# Patient Record
Sex: Male | Born: 1995 | Race: White | Hispanic: No | Marital: Married
Health system: Midwestern US, Community
[De-identification: ages and names within clinical notes are randomized; demographics above are authoritative.]

## PROBLEM LIST (undated history)

## (undated) DIAGNOSIS — M545 Low back pain, unspecified: Secondary | ICD-10-CM

## (undated) DIAGNOSIS — M5136 Other intervertebral disc degeneration, lumbar region: Secondary | ICD-10-CM

## (undated) HISTORY — DX: Low back pain, unspecified: M54.50

## (undated) HISTORY — PX: WISDOM TOOTH EXTRACTION: SHX21

## (undated) HISTORY — DX: Other intervertebral disc degeneration, lumbar region: M51.36

---
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
## 2016-08-29 ENCOUNTER — Emergency Department (HOSPITAL_BASED_OUTPATIENT_CLINIC_OR_DEPARTMENT_OTHER)
Admission: EM | Admit: 2016-08-29 | Discharge: 2016-08-29 | Disposition: A | Discharge status: Home / Self Care | Payer: BLUE CROSS/BLUE SHIELD | Attending: Emergency Medicine | Admitting: Emergency Medicine

## 2016-08-29 ENCOUNTER — Encounter (HOSPITAL_BASED_OUTPATIENT_CLINIC_OR_DEPARTMENT_OTHER): Payer: Self-pay

## 2016-08-29 ENCOUNTER — Emergency Department (HOSPITAL_BASED_OUTPATIENT_CLINIC_OR_DEPARTMENT_OTHER): Payer: BLUE CROSS/BLUE SHIELD

## 2016-08-29 DIAGNOSIS — S99922A Unspecified injury of left foot, initial encounter: Secondary | ICD-10-CM | POA: Diagnosis present

## 2016-08-29 DIAGNOSIS — Y30XXXA Falling, jumping or pushed from a high place, undetermined intent, initial encounter: Secondary | ICD-10-CM | POA: Diagnosis not present

## 2016-08-29 DIAGNOSIS — Y929 Unspecified place or not applicable: Secondary | ICD-10-CM | POA: Insufficient documentation

## 2016-08-29 DIAGNOSIS — Y9339 Activity, other involving climbing, rappelling and jumping off: Secondary | ICD-10-CM | POA: Insufficient documentation

## 2016-08-29 DIAGNOSIS — Y999 Unspecified external cause status: Secondary | ICD-10-CM | POA: Insufficient documentation

## 2016-08-29 DIAGNOSIS — S9032XA Contusion of left foot, initial encounter: Secondary | ICD-10-CM | POA: Insufficient documentation

## 2016-08-29 DIAGNOSIS — F17210 Nicotine dependence, cigarettes, uncomplicated: Secondary | ICD-10-CM | POA: Insufficient documentation

## 2016-08-29 MED ORDER — IBUPROFEN 400 MG PO TABS
600.0000 mg | ORAL_TABLET | Freq: Once | ORAL | Status: AC
  Administered 2016-08-29: 600 mg via ORAL
  Filled 2016-08-29: qty 1

## 2016-08-29 MED ORDER — HYDROCODONE-ACETAMINOPHEN 5-325 MG PO TABS
1.0000 | ORAL_TABLET | Freq: Once | ORAL | Status: DC
Start: 2016-08-29 — End: 2016-08-29

## 2016-08-29 NOTE — ED Notes (Signed)
Patient transported to X-ray 

## 2016-08-29 NOTE — ED Triage Notes (Signed)
Pt states he jumped off 8-9 foot drop last night-pain to left foot-NAD-walking with crutches

## 2016-08-29 NOTE — ED Provider Notes (Signed)
[facility]   CSN: [id number] Arrival date & time: 08/29/16  1752  By signing my name below, I, [name], attest that this documentation has been prepared under the direction and in the presence of Pricilla Loveless, MD. Electronically Signed: Gillis Ends. Lyn Hollingshead, ED Scribe. 08/29/16. 6:20 PM.  History   Chief Complaint Chief Complaint  Patient presents with  . Foot Pain    HPI HPI Comments: Tyler [name] is a 21 y.o. male who presents to the Emergency Department complaining of sudden onset, constant, left foot pain in the calcaneus region that occurred during the evening of 08/28/16. Pt reports that he "jumped off of an 8 foot column" when the pain suddenly began. He notes that he was wearing cowboy boots. Associated swelling of the left foot present. He notes that pain is exacerbated when bearing weight.  Denies any numbness to the extremity, back pain, or right foot pain.   The history is provided by the patient. No language interpreter was used.    History reviewed. No pertinent past medical history.  There are no active problems to display for this patient.  History reviewed. No pertinent surgical history.   Home Medications    Prior to Admission medications   Not on File    Family History No family history on file.  Social History Social History  Substance Use Topics  . Smoking status: Current Every Day Smoker    Types: Cigarettes  . Smokeless tobacco: Former Neurosurgeon  . Alcohol use Yes     Comment: occ    Allergies   Review of patient's allergies indicates no known allergies.   Review of Systems Review of Systems  Musculoskeletal: Positive for arthralgias and joint swelling. Negative for back pain.  Neurological: Negative for numbness.  All other systems reviewed and are negative.  Physical Exam Updated Vital Signs BP 125/81 (BP Location: Left Arm)   Pulse 98   Temp 98.1 F (36.7 C) (Oral)   Resp 18   Ht 6'  1" (1.854 m)   Wt 160 lb (72.6 kg)   SpO2 100%   BMI 21.11 kg/m   Physical Exam  Constitutional: He is oriented to person, place, and time. He appears well-developed and well-nourished.  HENT:  Head: Normocephalic and atraumatic.  Right Ear: External ear normal.  Left Ear: External ear normal.  Nose: Nose normal.  Eyes: Right eye exhibits no discharge. Left eye exhibits no discharge.  Neck: Neck supple.  Cardiovascular: Normal rate, normal heart sounds and intact distal pulses.   2+ DP pulse left foot  Pulmonary/Chest: Effort normal.  Abdominal: He exhibits no distension.  Musculoskeletal: He exhibits no edema.       Left ankle: He exhibits normal range of motion. No tenderness. Achilles tendon exhibits no pain and no defect.       Left foot: There is tenderness and swelling.       Feet:  Neurological: He is alert and oriented to person, place, and time.  Skin: Skin is warm and dry.  Nursing note and vitals reviewed.  ED Treatments / Results  DIAGNOSTIC STUDIES: Oxygen Saturation is 100% on RA, normal by my interpretation.    COORDINATION OF CARE: 6:10 PM-Discussed treatment plan which includes X-ray of left foot and order of Ibuprofen with pt at bedside and pt agreed to plan.  Radiology Dg Foot Complete Left  Result Date: 08/29/2016 CLINICAL DATA:  Left heel injury with swelling. EXAM: LEFT FOOT - COMPLETE 3+  VIEW COMPARISON:  None. FINDINGS: There is no evidence of fracture or dislocation. There is no evidence of arthropathy or other focal bone abnormality. Soft tissues are unremarkable. IMPRESSION: Negative. Electronically Signed   By: Delbert PhenixJason A Poff M.D.   On: 08/29/2016 18:29   Procedures Procedures (including critical care time)  Medications Ordered in ED Medications  ibuprofen (ADVIL,MOTRIN) tablet 600 mg (600 mg Oral Given 08/29/16 1826)   Initial Impression / Assessment and Plan / ED Course  I have reviewed the triage vital signs and the nursing  notes.  Pertinent labs & imaging results that were available during my care of the patient were reviewed by me and considered in my medical decision making (see chart for details).  Clinical Course    X-ray shows no obvious fracture. Given he cannot bear weight, continued use crutches that he heart he has at home and will Ace wrap. I doubt missed fracture, but if he is still unable to bear weight or having pain he will need to follow-up with with the orthopedics. Ibuprofen and ice.  Final Clinical Impressions(s) / ED Diagnoses   Final diagnoses:  Contusion of left heel, initial encounter    New Prescriptions New Prescriptions   No medications on file   I personally performed the services described in this documentation, which was scribed in my presence. The recorded information has been reviewed and is accurate.     Pricilla LovelessScott Mariam Helbert, MD 08/29/16 [phone]

## 2018-02-24 ENCOUNTER — Emergency Department (HOSPITAL_BASED_OUTPATIENT_CLINIC_OR_DEPARTMENT_OTHER): Payer: Worker's Compensation

## 2018-02-24 ENCOUNTER — Other Ambulatory Visit: Payer: Self-pay

## 2018-02-24 ENCOUNTER — Emergency Department (HOSPITAL_BASED_OUTPATIENT_CLINIC_OR_DEPARTMENT_OTHER)
Admission: EM | Admit: 2018-02-24 | Discharge: 2018-02-25 | Disposition: A | Discharge status: Home / Self Care | Payer: Worker's Compensation | Attending: Emergency Medicine | Admitting: Emergency Medicine

## 2018-02-24 ENCOUNTER — Encounter (HOSPITAL_BASED_OUTPATIENT_CLINIC_OR_DEPARTMENT_OTHER): Payer: Self-pay | Admitting: *Deleted

## 2018-02-24 DIAGNOSIS — F17210 Nicotine dependence, cigarettes, uncomplicated: Secondary | ICD-10-CM | POA: Diagnosis not present

## 2018-02-24 DIAGNOSIS — M545 Low back pain, unspecified: Secondary | ICD-10-CM

## 2018-02-24 MED ORDER — KETOROLAC TROMETHAMINE 30 MG/ML IJ SOLN
30.0000 mg | Freq: Once | INTRAMUSCULAR | Status: AC
  Administered 2018-02-24: 30 mg via INTRAMUSCULAR
  Filled 2018-02-24: qty 1

## 2018-02-24 NOTE — ED Triage Notes (Signed)
Back injury at work today. Pt states he is filing injury with his private insurance.

## 2018-02-25 MED ORDER — PREDNISONE 10 MG PO TABS: 20.0000 mg | ORAL_TABLET | Freq: Every day | ORAL | 0 refills | Status: AC

## 2018-02-25 MED ORDER — CYCLOBENZAPRINE HCL 10 MG PO TABS: 10.0000 mg | ORAL_TABLET | Freq: Two times a day (BID) | ORAL | 0 refills | Status: DC | PRN

## 2018-02-25 MED ORDER — PREDNISONE 50 MG PO TABS
60.0000 mg | ORAL_TABLET | Freq: Once | ORAL | Status: AC
  Administered 2018-02-25: 01:00:00 60 mg via ORAL
  Filled 2018-02-25: qty 1

## 2018-02-25 NOTE — ED Provider Notes (Signed)
[facility] EMERGENCY DEPARTMENT Provider Note   CSN: [id number] Arrival date & time: 02/24/18  2004     History   Chief Complaint Chief Complaint  Patient presents with  . Back Pain    HPI Tyler [name] is a 23 y.o. male presenting to the ED with acute onset of low back pain after injury at work around 11:30 AM today.  Patient states he was squatting down to lift an object, and felt immediate pain.  Localizes pain to lower back over sacrum, that is worse with movement.  Medications taken for pain prior to arrival.  Denies saddle paresthesias, weakness in extremities, bowel or bladder incontinence, or other complaints.  No previous injuries to back.  The history is provided by the patient.    History reviewed. No pertinent past medical history.  There are no active problems to display for this patient.   Past Surgical History:  Procedure Laterality Date  . WISDOM TOOTH EXTRACTION         Home Medications    Prior to Admission medications   Medication Sig Start Date End Date Taking? Authorizing Provider  cyclobenzaprine (FLEXERIL) 10 MG tablet Take 1 tablet (10 mg total) by mouth 2 (two) times daily as needed for muscle spasms. 02/25/18   Emit Kuenzel, [place] N, PA-C  predniSONE (DELTASONE) 10 MG tablet Take 2 tablets (20 mg total) by mouth daily for 5 days. 02/25/18 03/02/18  Juanmanuel Marohl, [place] N, PA-C    Family History No family history on file.  Social History Social History   Tobacco Use  . Smoking status: Current Every Day Smoker    Types: Cigarettes  . Smokeless tobacco: Former Engineer, waterUser  Substance Use Topics  . Alcohol use: Yes    Comment: occ  . Drug use: No     Allergies   Patient has no known allergies.   Review of Systems Review of Systems  Gastrointestinal:       No bowel incontinence  Genitourinary: Negative for difficulty urinating.  Musculoskeletal: Positive for back pain.  Neurological: Negative for weakness.     Physical  Exam Updated Vital Signs BP 135/86 (BP Location: Left Arm)   Pulse (!) 111   Temp 99.1 F (37.3 C) (Oral)   Resp 16   Ht 5\' 11"  (1.803 m)   Wt 68 kg (150 lb)   SpO2 100%   BMI 20.92 kg/m   Physical Exam  Constitutional: He appears well-developed and well-nourished. No distress.  HENT:  Head: Normocephalic and atraumatic.  Eyes: Conjunctivae are normal.  Cardiovascular: Normal rate and intact distal pulses.  Pulmonary/Chest: Effort normal.  Musculoskeletal:  No midline C, T, or L-spine tenderness.  TTP over sacral region, without gross deformities.  Neurological:  Motor:  Normal tone. 5/5 in  lower extremities bilaterally including strong and equal dorsiflexion/plantar flexion Sensory: Pinprick and light touch normal in all extremities.  Deep Tendon Reflexes: 2+ and symmetric in the biceps and patella Gait: normal gait and balance CV: distal pulses palpable throughout    Psychiatric: He has a normal mood and affect. His behavior is normal.  Nursing note and vitals reviewed.    ED Treatments / Results  Labs (all labs ordered are listed, but only abnormal results are displayed) Labs Reviewed - No data to display  EKG  EKG Interpretation None       Radiology Dg Lumbar Spine Complete  Result Date: 02/24/2018 CLINICAL DATA:  Back pain EXAM: LUMBAR SPINE - COMPLETE 4+ VIEW COMPARISON:  None.  FINDINGS: There is no evidence of lumbar spine fracture. Alignment is normal. Minimal disc space narrowing at L5-S1. IMPRESSION: No acute osseous abnormality Electronically Signed   By: Jasmine Pang M.D.   On: 02/24/2018 23:42    Procedures Procedures (including critical care time)  Medications Ordered in ED Medications  predniSONE (DELTASONE) tablet 60 mg (not administered)  ketorolac (TORADOL) 30 MG/ML injection 30 mg (30 mg Intramuscular Given 02/24/18 2341)     Initial Impression / Assessment and Plan / ED Course  I have reviewed the triage vital signs and the nursing  notes.  Pertinent labs & imaging results that were available during my care of the patient were reviewed by me and considered in my medical decision making (see chart for details).    Patient with acute sacral back pain.  No neurological deficits and normal neuro exam.  Patient can walk but states is painful.  No loss of bowel or bladder control.  No concern for cauda equina. Xray without acute fracture. No fever, night sweats, weight loss, h/o cancer, IVDU.  RICE protocol and pain medicine indicated and discussed with patient.   Discussed results, findings, treatment and follow up. Patient advised of return precautions. Patient verbalized understanding and agreed with plan.  Final Clinical Impressions(s) / ED Diagnoses   Final diagnoses:  Acute bilateral low back pain without sciatica    ED Discharge Orders        Ordered    predniSONE (DELTASONE) 10 MG tablet  Daily     02/25/18 0008    cyclobenzaprine (FLEXERIL) 10 MG tablet  2 times daily PRN     02/25/18 0008       Ashkan Chamberland, [place] N, PA-C 02/25/18 0025    Shon Baton, MD 02/25/18 [phone]

## 2018-02-25 NOTE — ED Notes (Signed)
Pt teaching provided on medications that may cause drowsiness. Pt instructed not to drive or operate heavy machinery while taking the prescribed medication. Pt verbalized understanding.   

## 2018-02-25 NOTE — Discharge Instructions (Signed)
Please read instructions below.  Talk with your PCP about any new medications.  You can take aleve every 12 hours for pain.  Tomorrow, take prednisone daily for 5 days. Apply ice to your back for 20 minutes at a time.  Return to ER if new numbness or tingling in your arms or legs, inability to urinate, inability to hold your bowels, or weakness in your extremities.

## 2018-03-04 ENCOUNTER — Other Ambulatory Visit: Payer: Self-pay

## 2018-03-04 ENCOUNTER — Encounter (HOSPITAL_COMMUNITY): Payer: Self-pay | Admitting: Emergency Medicine

## 2018-03-04 ENCOUNTER — Emergency Department (HOSPITAL_COMMUNITY)
Admission: EM | Admit: 2018-03-04 | Discharge: 2018-03-05 | Disposition: A | Discharge status: Home / Self Care | Payer: Worker's Compensation | Attending: Emergency Medicine | Admitting: Emergency Medicine

## 2018-03-04 DIAGNOSIS — F17210 Nicotine dependence, cigarettes, uncomplicated: Secondary | ICD-10-CM | POA: Insufficient documentation

## 2018-03-04 DIAGNOSIS — M5417 Radiculopathy, lumbosacral region: Secondary | ICD-10-CM | POA: Insufficient documentation

## 2018-03-04 DIAGNOSIS — M549 Dorsalgia, unspecified: Secondary | ICD-10-CM | POA: Diagnosis present

## 2018-03-04 NOTE — ED Triage Notes (Signed)
Pt c/o lower back pain 5/10 for the past week, pt states he got seen for the same last week they prescribed pain medication and muscle relaxer and he is not getting better.

## 2018-03-05 MED ORDER — IBUPROFEN 400 MG PO TABS: 400.0000 mg | ORAL_TABLET | Freq: Four times a day (QID) | ORAL | 0 refills | Status: DC | PRN

## 2018-03-05 NOTE — ED Provider Notes (Signed)
[name] EMERGENCY DEPARTMENT Provider Note   CSN: [id number] Arrival date & time: 03/04/18  2206     History   Chief Complaint Chief Complaint  Patient presents with  . Back Pain    HPI Tyler FAUCETT is a 23 y.o. male.  The history is provided by the patient.  Back Pain   This is a recurrent problem. The current episode started more than 2 days ago. The problem occurs daily. The problem has been gradually worsening. The pain is associated with lifting heavy objects. The pain is present in the lumbar spine. The pain is moderate. The symptoms are aggravated by certain positions. Pertinent negatives include no chest pain, no fever, no abdominal pain, no bladder incontinence and no weakness. He has tried muscle relaxants (prednisone) for the symptoms. The treatment provided no relief.  Patient presents for back pain. Patient was first seen on February 25 for a back injury due to work.  He had an x-ray that was done that was negative, and was discharged home with Flexeril and prednisone, but is still having back pain.  He does report that once he went back to work his pain worsened.  No new leg weakness.  No incontinence is reported.  No history of back surgery.  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 WISDOM TOOTH EXTRACTION         Home Medications    Prior to Admission medications   Medication Sig Start Date End Date Taking? Authorizing Provider  ibuprofen (ADVIL,MOTRIN) 400 MG tablet Take 1 tablet (400 mg total) by mouth every 6 (six) hours as needed for moderate pain. 03/05/18   Tyler Rhine, MD    Family History No family history on file.  Social History Social History   Tobacco Use  . Smoking status: Current Every Day Smoker    Types: Cigarettes  . Smokeless tobacco: Former  Engineer, water Use Topics  . Alcohol use: Yes    Comment: occ  . Drug use: No     Allergies   Patient has no known allergies.   Review of Systems Review of Systems  Constitutional: Negative for fever.  Cardiovascular: Negative for chest pain.  Gastrointestinal: Negative for abdominal pain.  Genitourinary: Negative for bladder incontinence.  Musculoskeletal: Positive for back pain.  Neurological: Negative for weakness.  All other systems reviewed and are negative.    Physical Exam Updated Vital Signs BP 140/77   Pulse 96   Temp 98.5 F (36.9 C) (Oral)   Resp 18   Ht 1.803 m (5\' 11" )   Wt 68 kg (150 lb)   SpO2 98%   BMI 20.92 kg/m   Physical Exam CONSTITUTIONAL: Well developed/well nourished HEAD: Normocephalic/atraumatic EYES: EOMI ENMT: Mucous membranes moist NECK: supple no meningeal signs SPINE/BACK:entire spine nontender, no bruising/crepitance/stepoffs noted to spine CV: S1/S2 noted, no murmurs/rubs/gallops noted LUNGS: Lungs are clear to auscultation bilaterally, no apparent distress ABDOMEN: soft, nontender GU:no cva tenderness NEURO: Awake/alert equal motor 5/5 strength noted with the following: hip flexion/knee flexion/extension, foot dorsi/plantar flexion, no sensory deficit.  Pt is able to ambulate unassisted. EXTREMITIES: pulses normal, full ROM SKIN: warm, color normal PSYCH: no abnormalities of mood noted, alert and oriented to situation    ED Treatments / Results  Labs (all labs ordered are listed, but  only abnormal results are displayed) Labs Reviewed - No data to display  EKG  EKG Interpretation None       Radiology No results found.  Procedures Procedures (including critical care time)  Medications Ordered in ED Medications - No data to display   Initial Impression / Assessment and Plan / ED Course  I have reviewed the triage vital signs and the nursing notes.       Patient With repeat visit for continued back pain  after a possible work injury.  He has no neuro deficits.  He is ambulatory.  He may have a radiculopathy, as he reports he has been diagnosed with this previously.  Plan will be off of work for the next several days, and advised him he may develop with a specialist.  He may need to go through [employer] to have this completed  Final Clinical Impressions(s) / ED Diagnoses   Final diagnoses:  Lumbosacral radiculopathy    ED Discharge Orders        Ordered    ibuprofen (ADVIL,MOTRIN) 400 MG tablet  Every 6 hours PRN     03/05/18 0117       [facility] [name], Tyler Whitworth, MD 03/05/18 0130

## 2018-03-05 NOTE — Discharge Instructions (Signed)

## 2018-03-05 NOTE — ED Notes (Signed)
Pt unable to sign for discharge, pt verbalizes consent for discharge

## 2018-03-09 ENCOUNTER — Emergency Department (HOSPITAL_COMMUNITY)
Admission: EM | Admit: 2018-03-09 | Discharge: 2018-03-09 | Disposition: A | Discharge status: Home / Self Care | Payer: Worker's Compensation | Attending: Emergency Medicine | Admitting: Emergency Medicine

## 2018-03-09 ENCOUNTER — Encounter (HOSPITAL_COMMUNITY): Payer: Self-pay | Admitting: Emergency Medicine

## 2018-03-09 DIAGNOSIS — F17210 Nicotine dependence, cigarettes, uncomplicated: Secondary | ICD-10-CM | POA: Diagnosis not present

## 2018-03-09 DIAGNOSIS — M545 Low back pain: Secondary | ICD-10-CM | POA: Insufficient documentation

## 2018-03-09 DIAGNOSIS — G8929 Other chronic pain: Secondary | ICD-10-CM

## 2018-03-09 MED ORDER — METHOCARBAMOL 500 MG PO TABS: 500.0000 mg | ORAL_TABLET | Freq: Every evening | ORAL | 0 refills | Status: DC | PRN

## 2018-03-09 MED ORDER — IBUPROFEN 600 MG PO TABS: 600.0000 mg | ORAL_TABLET | Freq: Four times a day (QID) | ORAL | 0 refills | Status: DC | PRN

## 2018-03-09 MED ORDER — MENTHOL-CAMPHOR 16-11 % EX CREA: 1.0000 "application " | TOPICAL_CREAM | Freq: Three times a day (TID) | CUTANEOUS | 0 refills | Status: DC | PRN

## 2018-03-09 NOTE — ED Triage Notes (Signed)
Pt states 3 weeks ago injured his back. Completing workers comp. Was told to follow up with the neurologist. Seen here 3/5 for pain that was not getting better. Waiting to hear back from workers comp to see if they have a neurologist they would prefer. States he is here today for a "check up" to make sure there is no changes and states he needs a longer work note because his job wont let him go back to work until the work comp is completed, states he has to be seen so he can get another work note.

## 2018-03-09 NOTE — Discharge Instructions (Signed)
As discussed, The medicine prescribed can help with muscle spasm but cannot be taken if driving, with alcohol or operating machinery.  ° °Follow up with your Primary care provider if symptoms persist beyond a week. ° °Return if worsening or new concerning symptoms in the meantime.  °

## 2018-03-09 NOTE — ED Notes (Signed)
Declined W/C at D/C and was escorted to lobby by RN. 

## 2018-03-09 NOTE — ED Provider Notes (Signed)
[facility]   CSN: [id number] Arrival date & time: 03/09/18  1433     History   Chief Complaint Chief Complaint  Patient presents with  . Back Pain  . work note    HPI Tyler [name] is a 23 y.o. male with no past medical history presenting with 3 weeks of chronic lower back pain after lifting a heavy wall at work.  He states that he was seen initially prescribed Flexeril and prednisone and told to return if no improvement.  He returned due to continued pain and was referred to neurologist but due to issues with workmen compensation he did not want to see anyone that was not approved.  He is here because he did not receive a call from them and is unsure if he is cleared to follow-up. He denies any new symptoms other than one episode of tingling in his legs which fully resolved.  No loss of bowel or bladder function, no difficulty walking. Ports improvement with relaxation, rest, muscle relaxer at nighttime. His pain is aggravated by going from a seated position to standing.  He has taken his medications as prescribed with some relief.  He is here because he needs to get a note so that he is able to follow-up tomorrow with neurology.   HPI  History reviewed. No pertinent past medical history.  There are no active problems to display for this patient.   Past Surgical History:  Procedure Laterality Date  . WISDOM TOOTH EXTRACTION         Home Medications    Prior to Admission medications   Medication Sig Start Date End Date Taking? Authorizing Provider  ibuprofen (ADVIL,MOTRIN) 600 MG tablet Take 1 tablet (600 mg total) by mouth every 6 (six) hours as needed. 03/09/18   Georgiana ShoreMitchell, Drequan Ironside B, PA-C  Menthol-Camphor (ICY HOT ADVANCED RELIEF) 16-11 % CREA Apply 1 application topically 3 (three) times daily as needed. 03/09/18   Georgiana ShoreMitchell, Khristie Sak B, PA-C  methocarbamol (ROBAXIN) 500 MG tablet Take 1 tablet (500 mg total) by  mouth at bedtime as needed. 03/09/18   Georgiana ShoreMitchell, Addaline Peplinski B, PA-C    Family History History reviewed. No pertinent family history.  Social History Social History   Tobacco Use  . Smoking status: Current Every Day Smoker    Types: Cigarettes  . Smokeless tobacco: Former Engineer, waterUser  Substance Use Topics  . Alcohol use: Yes    Comment: occ  . Drug use: No     Allergies   Patient has no known allergies.   Review of Systems Review of Systems  Constitutional: Negative for chills, diaphoresis, fatigue and fever.  HENT: Negative for sore throat.   Respiratory: Negative for cough and shortness of breath.   Cardiovascular: Negative for chest pain and palpitations.  Gastrointestinal: Negative for abdominal pain, nausea and vomiting.  Genitourinary: Negative for decreased urine volume, difficulty urinating, dysuria and hematuria.  Musculoskeletal: Positive for back pain and myalgias. Negative for arthralgias, gait problem, joint swelling, neck pain and neck stiffness.  Skin: Negative for color change, pallor and rash.  Neurological: Negative for weakness and numbness.       Patient experienced brief episode of tingling in his legs while feeding his dog earlier today. No symptoms since.     Physical Exam Updated Vital Signs BP 122/75 (BP Location: Right Arm)   Pulse (!) 114   Temp 98.6 F (37 C) (Oral)   Resp 14   Ht 5\' 11"  (  1.803 m)   Wt 68 kg (150 lb)   SpO2 99%   BMI 20.92 kg/m   Physical Exam  Constitutional: He appears well-developed and well-nourished. No distress.  Afebrile, well-appearing nontoxic sitting comfortably in bed no acute distress.  HENT:  Head: Normocephalic and atraumatic.  Eyes: Conjunctivae are normal.  Neck: Neck supple.  Cardiovascular: Normal rate, regular rhythm, normal heart sounds and intact distal pulses.  No murmur heard. Pulmonary/Chest: Effort normal and breath sounds normal. No stridor. No respiratory distress.  Musculoskeletal: Normal range  of motion. He exhibits no edema, tenderness or deformity.  No tenderness on palpation  Neurological: He is alert. No sensory deficit. He exhibits normal muscle tone.  5/5 strength to lower extremities bilaterally, normal stance and gait, normal balance.  Skin: Skin is warm and dry. No rash noted. He is not diaphoretic. No erythema. No pallor.  Psychiatric: He has a normal mood and affect.  Nursing note and vitals reviewed.    ED Treatments / Results  Labs (all labs ordered are listed, but only abnormal results are displayed) Labs Reviewed - No data to display  EKG  EKG Interpretation None       Radiology No results found.  Procedures Procedures (including critical care time)  Medications Ordered in ED Medications - No data to display   Initial Impression / Assessment and Plan / ED Course  I have reviewed the triage vital signs and the nursing notes.  Pertinent labs & imaging results that were available during my care of the patient were reviewed by me and considered in my medical decision making (see chart for details).    Patient presents with lower back pain. He has been seen twice for same in the last 3 weeks. He is requesting a note in order to allow time to follow up with neurology as previously prescribed.  No gross neurological deficits and normal neuro exam.  Heart rate 84 on my assessment.  No midline tenderness palpation. Patient has no gait abnormality or concern for cauda equina.  No loss of bowel or bladder control, fever, night sweats, weight loss, h/o malignancy, or IVDU.    Reviewed lumbar plain films from last visit which were normal.  RICE protocol and pain medications indicated and discussed with patient.   Advised patient to call first thing in the morning for his follow-up.  Will discharge home with Robaxin, muscle creams advised heat massage and ibuprofen 600 every 6 hours and close follow-up with PCP.  Discussed strict return precautions and  advised to return to the emergency department if experiencing any new or worsening symptoms. Instructions were understood and patient agreed with discharge plan.  Final Clinical Impressions(s) / ED Diagnoses   Final diagnoses:  Chronic midline low back pain without sciatica    ED Discharge Orders        Ordered    ibuprofen (ADVIL,MOTRIN) 600 MG tablet  Every 6 hours PRN     03/09/18 1654    methocarbamol (ROBAXIN) 500 MG tablet  At bedtime PRN     03/09/18 1654    Menthol-Camphor (ICY HOT ADVANCED RELIEF) 16-11 % CREA  3 times daily PRN     03/09/18 1654       Gregary Cromer 03/09/18 1725    Benjiman Core, MD 03/10/18 [phone]

## 2018-03-12 ENCOUNTER — Emergency Department (HOSPITAL_COMMUNITY)
Admission: EM | Admit: 2018-03-12 | Discharge: 2018-03-12 | Disposition: A | Discharge status: Home / Self Care | Payer: Worker's Compensation | Attending: Emergency Medicine | Admitting: Emergency Medicine

## 2018-03-12 ENCOUNTER — Other Ambulatory Visit: Payer: Self-pay

## 2018-03-12 DIAGNOSIS — Y9289 Other specified places as the place of occurrence of the external cause: Secondary | ICD-10-CM | POA: Insufficient documentation

## 2018-03-12 DIAGNOSIS — R200 Anesthesia of skin: Secondary | ICD-10-CM | POA: Diagnosis not present

## 2018-03-12 DIAGNOSIS — M545 Low back pain: Secondary | ICD-10-CM | POA: Insufficient documentation

## 2018-03-12 DIAGNOSIS — R202 Paresthesia of skin: Secondary | ICD-10-CM | POA: Insufficient documentation

## 2018-03-12 DIAGNOSIS — X500XXA Overexertion from strenuous movement or load, initial encounter: Secondary | ICD-10-CM | POA: Diagnosis not present

## 2018-03-12 DIAGNOSIS — F17210 Nicotine dependence, cigarettes, uncomplicated: Secondary | ICD-10-CM | POA: Diagnosis not present

## 2018-03-12 DIAGNOSIS — G8929 Other chronic pain: Secondary | ICD-10-CM

## 2018-03-12 DIAGNOSIS — Y990 Civilian activity done for income or pay: Secondary | ICD-10-CM | POA: Insufficient documentation

## 2018-03-12 DIAGNOSIS — Y9389 Activity, other specified: Secondary | ICD-10-CM | POA: Diagnosis not present

## 2018-03-12 NOTE — ED Notes (Signed)
No answer for triage.

## 2018-03-12 NOTE — ED Provider Notes (Signed)
[facility] EM[facility]ENC[facility] Provider Note   CSN: [id number] Arrival date & time: 03/12/18  1755     History   Chief Complaint Chief Complaint  Patient presents with  . Back Pain    HPI Jetta LoutZachary K Madera is a 23 y.o. male who presents with a request for a work note.  He states that he initially injured his back in February.  He was lifting a folding wall and his coworkers let go of it before he did and he felt an acute onset of low back pain.  He was seen at [facility], had plain films of his low back which was unremarkable, and has been seen 2 subsequent times in the emergency department for this problem.  He was initially diagnosed with a muscle strain however his pain has been persistent and he has had numbness and tingling of his legs at times.  He was told that he should follow-up with neurosurgery however he has not been able to because the injury is under Workmen's Comp. and he states he does not have a claim number yet.  He talked to his lawyer today who advised him to come to the emergency department for work note.  He denies any additional complaints.  HPI  No past medical history on file.  There are no active problems to display for this patient.   Past Surgical History:  Procedure Laterality Date  . WISDOM TOOTH EXTRACTION         Home Medications    Prior to Admission medications   Medication Sig Start Date End Date Taking? Authorizing Provider  ibuprofen (ADVIL,MOTRIN) 600 MG tablet Take 1 tablet (600 mg total) by mouth every 6 (six) hours as needed. 03/09/18   Georgiana ShoreMitchell, Jessica B, PA-C  Menthol-Camphor (ICY HOT ADVANCED RELIEF) 16-11 % CREA Apply 1 application topically 3 (three) times daily as needed. 03/09/18   Georgiana ShoreMitchell, Jessica B, PA-C  methocarbamol (ROBAXIN) 500 MG tablet Take 1 tablet (500 mg total) by mouth at bedtime as needed. 03/09/18   Georgiana ShoreMitchell, Jessica B, PA-C    Family History No family history on file.  Social  History Social History   Tobacco Use  . Smoking status: Current Every Day Smoker    Types: Cigarettes  . Smokeless tobacco: Former Engineer, waterUser  Substance Use Topics  . Alcohol use: Yes    Comment: occ  . Drug use: No     Allergies   Patient has no known allergies.   Review of Systems Review of Systems  Musculoskeletal: Positive for back pain. Negative for gait problem.  Neurological: Negative for weakness.     Physical Exam Updated Vital Signs BP (!) 142/94   Pulse 76   Temp 97.9 F (36.6 C)   Resp 18   Ht 5\' 11"  (1.803 m)   Wt 68 kg (150 lb)   SpO2 99%   BMI 20.92 kg/m   Physical Exam  Constitutional: He is oriented to person, place, and time. He appears well-developed and well-nourished. No distress.  Calm, cooperative, polite  HENT:  Head: Normocephalic and atraumatic.  Eyes: Conjunctivae are normal. Pupils are equal, round, and reactive to light. Right eye exhibits no discharge. Left eye exhibits no discharge. No scleral icterus.  Neck: Normal range of motion.  Cardiovascular: Normal rate.  Pulmonary/Chest: Effort normal. No respiratory distress.  Abdominal: He exhibits no distension.  Musculoskeletal:  Back: Inspection: No masses, deformity, or rash Palpation: No midline spinal tenderness. No paraspinal muscle tenderness. Pt reports pain  with back movement. Strength: 5/5 in lower extremities and normal plantar and dorsiflexion Sensation: Intact sensation with light touch in lower extremities bilaterally Gait: Normal gait   Neurological: He is alert and oriented to person, place, and time.  Skin: Skin is warm and dry.  Psychiatric: He has a normal mood and affect. His behavior is normal.  Nursing note and vitals reviewed.    ED Treatments / Results  Labs (all labs ordered are listed, but only abnormal results are displayed) Labs Reviewed - No data to display  EKG  EKG Interpretation None       Radiology No results  found.  Procedures Procedures (including critical care time)  Medications Ordered in ED Medications - No data to display   Initial Impression / Assessment and Plan / ED Course  I have reviewed the triage vital signs and the nursing notes.  Pertinent labs & imaging results that were available during my care of the patient were reviewed by me and considered in my medical decision making (see chart for details).  23 year old male presents essentially for a work note for his chronic back pain.  He was given 2 days.  He denies severe back pain, current radicular symptoms, weakness, bowel or bladder incontinence.  He was advised to follow-up with neurosurgery and return if worsening.  Final Clinical Impressions(s) / ED Diagnoses   Final diagnoses:  Chronic midline low back pain without sciatica    ED Discharge Orders    None       Bethel Born, PA-C 03/12/18 2215    Mancel Bale, MD 03/13/18 [phone]

## 2018-03-12 NOTE — ED Triage Notes (Signed)
Pt here for a work note, pt injured back in February r/t workers comp. Pt lawyer wants him out of work until his back is better but pt has not followed up with neurosurgery to receive a work note.

## 2018-03-16 ENCOUNTER — Ambulatory Visit (HOSPITAL_COMMUNITY)
Admission: EM | Admit: 2018-03-16 | Discharge: 2018-03-16 | Disposition: A | Discharge status: Home / Self Care | Payer: Worker's Compensation

## 2018-03-16 ENCOUNTER — Encounter (HOSPITAL_COMMUNITY): Payer: Self-pay | Admitting: *Deleted

## 2018-03-16 DIAGNOSIS — M545 Low back pain: Secondary | ICD-10-CM | POA: Diagnosis not present

## 2018-03-16 NOTE — ED Provider Notes (Signed)
03/16/2018 6:44 PM   DOB: 1995-03-27 / MRN: [id number]  SUBJECTIVE:  Tyler [name] is a 23 y.o. male presenting for recheck of back pain.  He tells me "I need a note for work keeping me out until I can see my neurosurgeon."  He is already been to the emergency room twice for this issue.  His lawyers have advised that he not go to work and he needs a note stating such medically so he does not get fired from his job.  He has some tingling in his legs but no weakness.  He denies a loss of strength.  He has No Known Allergies.   He  has no past medical history on file.    He  reports that he has been smoking cigarettes.  He has quit using smokeless tobacco. He reports that he drinks alcohol. He reports that he does not use drugs. He  has no sexual activity history on file. The patient  has a past surgical history that includes Wisdom tooth extraction.  His family history includes Healthy in his father and mother.  Review of Systems  Constitutional: Negative for chills, diaphoresis and fever.  Respiratory: Negative for cough, hemoptysis, sputum production, shortness of breath and wheezing.   Cardiovascular: Negative for chest pain, orthopnea and leg swelling.  Gastrointestinal: Negative for nausea.  Genitourinary: Negative for dysuria, frequency, hematuria and urgency.  Skin: Negative for rash.  Neurological: Negative for dizziness.    OBJECTIVE:  BP 111/76 (BP Location: Right Arm)   Pulse 96   Temp 98.5 F (36.9 C) (Oral)   Resp 16   SpO2 100%   Physical Exam  Constitutional: He is oriented to person, place, and time. He appears well-developed. He is active and cooperative.  Non-toxic appearance.  Eyes: EOM are normal. Pupils are equal, round, and reactive to light.  Cardiovascular: Normal rate, regular rhythm, S1 normal, S2 normal, normal heart sounds, intact distal pulses and normal pulses. Exam reveals no gallop and no friction rub.  No murmur heard. Pulmonary/Chest: Effort  normal. No stridor. No tachypnea. No respiratory distress. He has no wheezes. He has no rales.  Abdominal: He exhibits no distension.  Musculoskeletal: He exhibits no edema.  Neurological: He is alert and oriented to person, place, and time. He has normal strength and normal reflexes. He is not disoriented. No cranial nerve deficit or sensory deficit. He exhibits normal muscle tone. Coordination and gait normal.  Skin: Skin is warm and dry. He is not diaphoretic. No pallor.  Psychiatric: His behavior is normal.  Vitals reviewed.   No results found for this or any previous visit (from the past 72 hour(s)).  No results found.  ASSESSMENT AND PLAN:  No orders of the defined types were placed in this encounter.    Acute low back pain, unspecified back pain laterality, with sciatica presence unspecified      The patient is advised to call or return to clinic if he does not see an improvement in symptoms, or to seek the care of the closest emergency department if he worsens with the above plan.   Deliah BostonMichael Konstance Happel, MHS, PA-C 03/16/2018 6:44 PM    Ofilia Neaslark, Gimena Buick L, PA-C 03/16/18 1845

## 2018-03-16 NOTE — Discharge Instructions (Signed)
Continue your meds.  I hope today's note suffices to keep you out of work without job.

## 2018-03-16 NOTE — ED Triage Notes (Signed)
Patient reports injuring his back at work 2/25. Patient has been seen in the EDs for the back pain and notes. Patient is waiting for neurosurgery to call him back for an appointment. Patient ambulatory. Patient reports constant lower back pain that is intermittent in pain level.

## 2019-06-22 ENCOUNTER — Other Ambulatory Visit: Payer: Self-pay | Admitting: *Deleted

## 2019-06-22 ENCOUNTER — Telehealth: Payer: Self-pay | Admitting: *Deleted

## 2019-06-22 NOTE — Telephone Encounter (Signed)
Patient called back and confirmed appointment with Dr. Donnetta Hutching and verbalized understanding to bring all films and notify case mgr.

## 2019-06-22 NOTE — Telephone Encounter (Signed)
Voice mail message left for patient and Newton Pigg case Freight forwarder. Appt  With Dr. Donnetta Hutching on 06/30/2019 to be at office at 8:05 and bring al films related to this procedure to this appointment. ALIF L5-S1 with Dr. Lynann Bologna scheduled for 07/06/2019. Asked patient to cll office back to confirm.

## 2019-06-24 ENCOUNTER — Other Ambulatory Visit: Payer: Self-pay | Admitting: Orthopedic Surgery

## 2019-06-30 ENCOUNTER — Ambulatory Visit (INDEPENDENT_AMBULATORY_CARE_PROVIDER_SITE_OTHER): Payer: Worker's Compensation | Admitting: Vascular Surgery

## 2019-06-30 ENCOUNTER — Encounter: Payer: Self-pay | Admitting: Vascular Surgery

## 2019-06-30 ENCOUNTER — Other Ambulatory Visit: Payer: Self-pay

## 2019-06-30 VITALS — BP 110/68 | HR 82 | Temp 97.5°F | Resp 20 | Ht 71.0 in | Wt 149.0 lb

## 2019-06-30 DIAGNOSIS — M5137 Other intervertebral disc degeneration, lumbosacral region: Secondary | ICD-10-CM

## 2019-06-30 NOTE — Progress Notes (Signed)
[facility] [place]  Patient name: Tyler [name] Prestia MRN: [id number] DOB: 09-12-1995 Sex: male  REASON FOR CONSULT: Discuss anterior exposure for L5-S1 disc surgery  HPI: Tyler [name] Cornwall is a 24 y.o. male, who is here today for discussion of upcoming anterior exposure for L5-S1 fusion.  Patient very pleasant healthy gentleman who had a injury in February 2019.  He had a injury while carrying large wall panels.  Has failed conservative treatment related to this.  He is seen Dr. Yevette Edwardsumonski in consultation who is recommended anterior interbody fusion.  He is here today for discussion of my role in this.  He has had no prior intra-abdominal surgery.  No history of cardiac or peripheral vascular occlusive disease.  Otherwise completely healthy.  Past Medical History:  Diagnosis Date  . DDD (degenerative disc disease), lumbar    L5-S1  . Low back pain    Ongoing Axial, secondary to L5-S1 DDD    Family History  Problem Relation Age of Onset  . Healthy Mother   . Healthy Father     SOCIAL HISTORY: Social History   Socioeconomic History  . Marital status: Married    Spouse name: Not on file  . Number of children: Not on file  . Years of education: Not on file  . Highest education level: Not on file  Occupational History  . Occupation: Corporate investment bankerConstruction Worker    Comment: [employer]   Social Needs  . Financial resource strain: Not on file  . Food insecurity    Worry: Not on file    Inability: Not on file  . Transportation needs    Medical: Not on file    Non-medical: Not on file  Tobacco Use  . Smoking status: Current Every Day Smoker    Packs/day: 0.25    Types: Cigarettes  . Smokeless tobacco: Former Engineer, waterUser  Substance and Sexual Activity  . Alcohol use: Yes    Comment: occ  . Drug use: No  . Sexual activity: Not on file  Lifestyle  . Physical activity    Days per week: Not on file    Minutes per session:  Not on file  . Stress: Not on file  Relationships  . Social Musicianconnections    Talks on phone: Not on file    Gets together: Not on file    Attends religious service: Not on file    Active member of club or organization: Not on file    Attends meetings of clubs or organizations: Not on file    Relationship status: Not on file  . Intimate partner violence    Fear of current or ex partner: Not on file    Emotionally abused: Not on file    Physically abused: Not on file    Forced sexual activity: Not on file  Other Topics Concern  . Not on file  Social History Narrative  . Not on file    No Known Allergies  No current outpatient medications on file.   No current facility-administered medications for this visit.     REVIEW OF SYSTEMS:  [X]  denotes positive finding, [ ]  denotes negative finding Cardiac  Comments:  Chest pain or chest pressure:    Shortness of breath upon exertion:    Short of breath when lying flat:    Irregular heart rhythm:        Vascular    Pain in calf, thigh, or hip brought on by ambulation:  Pain in feet at night that wakes you up from your sleep:     Blood clot in your veins:    Leg swelling:         Pulmonary    Oxygen at home:    Productive cough:     Wheezing:         Neurologic    Sudden weakness in arms or legs:     Sudden numbness in arms or legs:     Sudden onset of difficulty speaking or slurred speech:    Temporary loss of vision in one eye:     Problems with dizziness:         Gastrointestinal    Blood in stool:     Vomited blood:         Genitourinary    Burning when urinating:     Blood in urine:        Psychiatric    Major depression:         Hematologic    Bleeding problems:    Problems with blood clotting too easily:        Skin    Rashes or ulcers:        Constitutional    Fever or chills:      PHYSICAL EXAM: Vitals:   06/30/19 0827  BP: 110/68  Pulse: 82  Resp: 20  Temp: (!) 97.5 F (36.4 C)  SpO2:  95%  Weight: 149 lb (67.6 kg)  Height: 5\' 11"  (1.803 m)    GENERAL: The patient is a well-nourished male, in no acute distress. The vital signs are documented above. CARDIOVASCULAR: 2+ radial 2+ femoral and 2+ dorsalis pedis pulses bilaterally. PULMONARY: There is good air exchange  ABDOMEN: Soft and non-tender  MUSCULOSKELETAL: There are no major deformities or cyanosis. NEUROLOGIC: No focal weakness or paresthesias are detected. SKIN: There are no ulcers or rashes noted. PSYCHIATRIC: The patient has a normal affect.  DATA:  None  MEDICAL ISSUES: Discussed my role in exposure.  The procedure of low left transverse incision from the midline laterally.  Explained mobilization of the rectus muscle and entry into the retroperitoneal space.  Discussed mobilization of the left ureter and the arterial and venous structures overlying the spine.  I did discuss potential injury for all of these in particular discussed risk of venous injury.  Also discussed the slight risk of retrograde ejaculation with the patient as well.  He understands the procedure and wished to proceed as scheduled next week.   Rosetta Posner, MD FACS [facility] Tel [phone] Pager [phone]

## 2019-07-01 NOTE — Pre-Procedure Instructions (Signed)
Jetta LoutZachary K Dibella  07/01/2019     [facility] [address] ([place]), [place] [address] [place] [id number] Phone: [phone] Fax: [phone]   Your procedure is scheduled on Monday, July 6th  [facility] at 5:30 A.M.  Call this number if you have problems the morning of surgery:  [phone]   Remember:  Do not eat after midnight on July 5th.  You may drink clear liquids until 4:30 A.M.  Clear liquids allowed are:   Water, Juice (non-citric and without pulp), Carbonated beverages, Clear Tea, Black Coffee only, Plain Jell-O only and Gatorade  Please complete your PRE-SURGERY ENSURE that was provided to you 4:30 A.M.  Please, if able, drink it in one setting. DO NOT SIP.    Take these medicines the morning of surgery with A SIP OF WATER  NONE  As of today, STOP taking any Aspirin (unless otherwise instructed by your surgeon), Aleve, Naproxen, Ibuprofen, Motrin, Advil, Goody's, BC's, all herbal medications, fish oil, and all vitamins.   Special instructions:   [place]- Preparing For Surgery  Before surgery, you can play an important role. Because skin is not sterile, your skin needs to be as free of germs as possible. You can reduce the number of germs on your skin by washing with CHG (chlorahexidine gluconate) Soap before surgery.  CHG is an antiseptic cleaner which kills germs and bonds with the skin to continue killing germs even after washing.    Oral Hygiene is also important to reduce your risk of infection.  Remember - BRUSH YOUR TEETH THE MORNING OF SURGERY WITH YOUR REGULAR TOOTHPASTE  Please do not use if you have an allergy to CHG or antibacterial soaps. If your skin becomes reddened/irritated stop using the CHG.  Do not shave (including legs and underarms) for at least 48 hours prior to first CHG shower. It is OK to shave your face.  Please follow these instructions carefully.   1. Shower the  NIGHT BEFORE SURGERY and the MORNING OF SURGERY with CHG.   2. If you chose to wash your hair, wash your hair first as usual with your normal shampoo.  3. After you shampoo, rinse your hair and body thoroughly to remove the shampoo.  4. Use CHG as you would any other liquid soap. You can apply CHG directly to the skin and wash gently with a scrungie or a clean washcloth.   5. Apply the CHG Soap to your body ONLY FROM THE NECK DOWN.  Do not use on open wounds or open sores. Avoid contact with your eyes, ears, mouth and genitals (private parts). Wash Face and genitals (private parts)  with your normal soap.  6. Wash thoroughly, paying special attention to the area where your surgery will be performed.  7. Thoroughly rinse your body with warm water from the neck down.  8. DO NOT shower/wash with your normal soap after using and rinsing off the CHG Soap.  9. Pat yourself dry with a CLEAN TOWEL.  10. Wear CLEAN PAJAMAS to bed the night before surgery, wear comfortable clothes the morning of surgery  11. Place CLEAN SHEETS on your bed the night of your first shower and DO NOT SLEEP WITH PETS.  Day of Surgery: Do not wear jewelry, make-up or nail polish.  Do not wear lotions, powders, or perfumes/cologne, or deodorant.  Do not shave 48 hours prior to surgery.  Men may shave neck  and face.  Do not bring valuables to the hospital.  [facility] is not responsible for any belongings or valuables.  Please wear clean clothes to the hospital/surgery center.   Remember to brush your teeth WITH YOUR REGULAR TOOTHPASTE.  Contacts, dentures or bridgework may not be worn into surgery.  Leave your suitcase in the car.  After surgery it may be brought to your room.  For patients admitted to the hospital, discharge time will be determined by your treatment team.  Patients discharged the day of surgery will not be allowed to drive home.   Please read over the following fact sheets that you were  given. Pain Booklet, Coughing and Deep Breathing, MRSA Information and Surgical Site Infection Prevention

## 2019-07-02 ENCOUNTER — Other Ambulatory Visit (HOSPITAL_COMMUNITY)
Admission: RE | Admit: 2019-07-02 | Discharge: 2019-07-02 | Disposition: A | Discharge status: Home / Self Care | Payer: BLUE CROSS/BLUE SHIELD | Source: Ambulatory Visit | Attending: Orthopedic Surgery | Admitting: Orthopedic Surgery

## 2019-07-02 ENCOUNTER — Encounter (HOSPITAL_COMMUNITY): Payer: Self-pay

## 2019-07-02 ENCOUNTER — Encounter (HOSPITAL_COMMUNITY)
Admission: RE | Admit: 2019-07-02 | Discharge: 2019-07-02 | Disposition: A | Discharge status: Home / Self Care | Payer: Worker's Compensation | Source: Ambulatory Visit | Attending: Orthopedic Surgery | Admitting: Orthopedic Surgery

## 2019-07-02 ENCOUNTER — Other Ambulatory Visit: Payer: Self-pay

## 2019-07-02 DIAGNOSIS — M47816 Spondylosis without myelopathy or radiculopathy, lumbar region: Secondary | ICD-10-CM | POA: Insufficient documentation

## 2019-07-02 DIAGNOSIS — Z01812 Encounter for preprocedural laboratory examination: Secondary | ICD-10-CM | POA: Diagnosis present

## 2019-07-02 DIAGNOSIS — M545 Low back pain: Secondary | ICD-10-CM | POA: Diagnosis not present

## 2019-07-02 DIAGNOSIS — Z1159 Encounter for screening for other viral diseases: Secondary | ICD-10-CM | POA: Insufficient documentation

## 2019-07-02 LAB — CBC WITH DIFFERENTIAL/PLATELET
Abs Immature Granulocytes: 0.01 10*3/uL (ref 0.00–0.07)
Basophils Absolute: 0 10*3/uL (ref 0.0–0.1)
Basophils Relative: 1 %
Eosinophils Absolute: 0.4 10*3/uL (ref 0.0–0.5)
Eosinophils Relative: 6 %
HCT: 45.1 % (ref 39.0–52.0)
Hemoglobin: 15.5 g/dL (ref 13.0–17.0)
Immature Granulocytes: 0 %
Lymphocytes Relative: 31 %
Lymphs Abs: 2.1 10*3/uL (ref 0.7–4.0)
MCH: 31.9 pg (ref 26.0–34.0)
MCHC: 34.4 g/dL (ref 30.0–36.0)
MCV: 92.8 fL (ref 80.0–100.0)
Monocytes Absolute: 0.4 10*3/uL (ref 0.1–1.0)
Monocytes Relative: 6 %
Neutro Abs: 3.8 10*3/uL (ref 1.7–7.7)
Neutrophils Relative %: 56 %
Platelets: 207 10*3/uL (ref 150–400)
RBC: 4.86 MIL/uL (ref 4.22–5.81)
RDW: 12.5 % (ref 11.5–15.5)
WBC: 6.8 10*3/uL (ref 4.0–10.5)
nRBC: 0 % (ref 0.0–0.2)

## 2019-07-02 LAB — COMPREHENSIVE METABOLIC PANEL
ALT: 16 U/L (ref 0–44)
AST: 18 U/L (ref 15–41)
Albumin: 4.4 g/dL (ref 3.5–5.0)
Alkaline Phosphatase: 80 U/L (ref 38–126)
Anion gap: 9 (ref 5–15)
BUN: 5 mg/dL — ABNORMAL LOW (ref 6–20)
CO2: 25 mmol/L (ref 22–32)
Calcium: 9.4 mg/dL (ref 8.9–10.3)
Chloride: 104 mmol/L (ref 98–111)
Creatinine, Ser: 1.04 mg/dL (ref 0.61–1.24)
GFR calc Af Amer: >60 mL/min (ref >60)
GFR calc non Af Amer: >60 mL/min (ref >60)
Glucose, Bld: 119 mg/dL — ABNORMAL HIGH (ref 70–99)
Potassium: 3.6 mmol/L (ref 3.5–5.1)
Sodium: 138 mmol/L (ref 135–145)
Total Bilirubin: 0.6 mg/dL (ref 0.3–1.2)
Total Protein: 6.6 g/dL (ref 6.5–8.1)

## 2019-07-02 LAB — APTT: aPTT: 30 seconds (ref 24–36)

## 2019-07-02 LAB — URINALYSIS, ROUTINE W REFLEX MICROSCOPIC
Bilirubin Urine: NEGATIVE
Glucose, UA: NEGATIVE mg/dL
Hgb urine dipstick: NEGATIVE
Ketones, ur: NEGATIVE mg/dL
Leukocytes,Ua: NEGATIVE
Nitrite: NEGATIVE
Protein, ur: NEGATIVE mg/dL
Specific Gravity, Urine: 1.03 (ref 1.005–1.030)
pH: 5 (ref 5.0–8.0)

## 2019-07-02 LAB — TYPE AND SCREEN
ABO/RH(D): O POS
Antibody Screen: NEGATIVE

## 2019-07-02 LAB — ABO/RH: ABO/RH(D): O POS

## 2019-07-02 LAB — SARS CORONAVIRUS 2 (TAT 6-24 HRS): SARS Coronavirus 2: NEGATIVE

## 2019-07-02 LAB — PROTIME-INR
INR: 1.1 (ref 0.8–1.2)
Prothrombin Time: 13.9 seconds (ref 11.4–15.2)

## 2019-07-02 LAB — SURGICAL PCR SCREEN
MRSA, PCR: NEGATIVE
Staphylococcus aureus: NEGATIVE

## 2019-07-02 NOTE — Progress Notes (Addendum)
  Coronavirus Screening To be tested for COVID today at [facility] Have you experienced the following symptoms:  Cough yes/no: No Fever (>100.93F)  yes/no: No Runny nose yes/no: No Sore throat yes/no: No Difficulty breathing/shortness of breath  yes/no: No Have you or a family member traveled in the last 14 days and where? yes/no: No  PCP - denies  Cardiologist - denies  Chest x-ray - NA  EKG -NA  Stress Test - denies  ECHO -denies  Cardiac Cath - denies  AICD-denies PM-denies LOOP-denies  Sleep Study - denies CPAP - NA  LABS-PCR,CBC,CMP,UA,APTT,PT-INR,T/S  ASA-denies  ERAS-Ensure given with instructions  HA1C-denies Fasting Blood Sugar - NA Checks Blood Sugar _____ times a day  Anesthesia-N  Pt denies having chest pain, sob, or fever at this time. All instructions explained to the pt, with a verbal understanding of the material. Pt agrees to go over the instructions while at home for a better understanding. Pt also instructed to self quarantine after being tested for COVID-19. The opportunity to ask questions was provided.

## 2019-07-05 NOTE — Anesthesia Preprocedure Evaluation (Addendum)
Anesthesia Evaluation  Patient identified by MRN, date of birth, ID band Patient awake    Reviewed: Allergy & Precautions, H&P , NPO status , Patient's Chart, lab work & pertinent test results  Airway Mallampati: II  TM Distance: >3 FB Neck ROM: Full    Dental no notable dental hx. (+) Teeth Intact, Dental Advisory Given   Pulmonary neg pulmonary ROS, former smoker,    Pulmonary exam normal breath sounds clear to auscultation       Cardiovascular negative cardio ROS   Rhythm:Regular Rate:Normal     Neuro/Psych negative neurological ROS  negative psych ROS   GI/Hepatic negative GI ROS, Neg liver ROS,   Endo/Other  negative endocrine ROS  Renal/GU negative Renal ROS  negative genitourinary   Musculoskeletal  (+) Arthritis , Osteoarthritis,    Abdominal   Peds  Hematology negative hematology ROS (+)   Anesthesia Other Findings   Reproductive/Obstetrics negative OB ROS                            Anesthesia Physical Anesthesia Plan  ASA: I  Anesthesia Plan: General   Post-op Pain Management:    Induction: Intravenous  PONV Risk Score and Plan: 3 and Ondansetron, Dexamethasone and Midazolam  Airway Management Planned: Oral ETT  Additional Equipment:   Intra-op Plan:   Post-operative Plan: Extubation in OR  Informed Consent: I have reviewed the patients History and Physical, chart, labs and discussed the procedure including the risks, benefits and alternatives for the proposed anesthesia with the patient or authorized representative who has indicated his/her understanding and acceptance.     Dental advisory given  Plan Discussed with: CRNA  Anesthesia Plan Comments:         Anesthesia Quick Evaluation

## 2019-07-06 ENCOUNTER — Inpatient Hospital Stay (HOSPITAL_COMMUNITY): Payer: Worker's Compensation

## 2019-07-06 ENCOUNTER — Encounter (HOSPITAL_COMMUNITY)
Admission: RE | Disposition: A | Discharge status: Home / Self Care | Payer: Self-pay | Source: Home / Self Care | Attending: Orthopedic Surgery

## 2019-07-06 ENCOUNTER — Inpatient Hospital Stay (HOSPITAL_COMMUNITY): Payer: Worker's Compensation | Admitting: Anesthesiology

## 2019-07-06 ENCOUNTER — Other Ambulatory Visit: Payer: Self-pay

## 2019-07-06 ENCOUNTER — Encounter (HOSPITAL_COMMUNITY): Payer: Self-pay | Admitting: Orthopedic Surgery

## 2019-07-06 ENCOUNTER — Inpatient Hospital Stay: Payer: Self-pay

## 2019-07-06 ENCOUNTER — Inpatient Hospital Stay (HOSPITAL_COMMUNITY)
Admission: RE | Admit: 2019-07-06 | Discharge: 2019-07-07 | DRG: 455 | Disposition: A | Discharge status: Home / Self Care | Payer: Worker's Compensation | Attending: Orthopedic Surgery | Admitting: Orthopedic Surgery

## 2019-07-06 DIAGNOSIS — M5136 Other intervertebral disc degeneration, lumbar region: Secondary | ICD-10-CM | POA: Diagnosis present

## 2019-07-06 DIAGNOSIS — Z87891 Personal history of nicotine dependence: Secondary | ICD-10-CM | POA: Diagnosis not present

## 2019-07-06 DIAGNOSIS — M5117 Intervertebral disc disorders with radiculopathy, lumbosacral region: Secondary | ICD-10-CM | POA: Diagnosis not present

## 2019-07-06 DIAGNOSIS — M5137 Other intervertebral disc degeneration, lumbosacral region: Secondary | ICD-10-CM | POA: Diagnosis present

## 2019-07-06 DIAGNOSIS — M51379 Other intervertebral disc degeneration, lumbosacral region without mention of lumbar back pain or lower extremity pain: Secondary | ICD-10-CM | POA: Diagnosis present

## 2019-07-06 DIAGNOSIS — M545 Low back pain: Secondary | ICD-10-CM | POA: Diagnosis present

## 2019-07-06 DIAGNOSIS — Z419 Encounter for procedure for purposes other than remedying health state, unspecified: Secondary | ICD-10-CM

## 2019-07-06 HISTORY — PX: ABDOMINAL EXPOSURE: SHX5708

## 2019-07-06 HISTORY — PX: ANTERIOR LUMBAR FUSION: SHX1170

## 2019-07-06 SURGERY — ANTERIOR LUMBAR FUSION 2 LEVELS
Anesthesia: General | Site: Spine Lumbar

## 2019-07-06 MED ORDER — LACTATED RINGERS IV SOLN
INTRAVENOUS | Status: DC | PRN
  Administered 2019-07-06 (×2): via INTRAVENOUS

## 2019-07-06 MED ORDER — POTASSIUM CHLORIDE IN NACL 20-0.9 MEQ/L-% IV SOLN: INTRAVENOUS | Status: DC

## 2019-07-06 MED ORDER — 0.9 % SODIUM CHLORIDE (POUR BTL) OPTIME
TOPICAL | Status: DC | PRN
  Administered 2019-07-06: 08:00:00 1000 mL

## 2019-07-06 MED ORDER — DOCUSATE SODIUM 100 MG PO CAPS
100.0000 mg | ORAL_CAPSULE | Freq: Two times a day (BID) | ORAL | Status: DC
  Administered 2019-07-06 – 2019-07-07 (×2): 100 mg via ORAL
  Filled 2019-07-06 (×2): qty 1

## 2019-07-06 MED ORDER — ONDANSETRON HCL 4 MG PO TABS: 4.0000 mg | ORAL_TABLET | Freq: Four times a day (QID) | ORAL | Status: DC | PRN

## 2019-07-06 MED ORDER — CEFAZOLIN SODIUM 1 G IJ SOLR
INTRAMUSCULAR | Status: AC
  Filled 2019-07-06: qty 20

## 2019-07-06 MED ORDER — PHENYLEPHRINE 40 MCG/ML (10ML) SYRINGE FOR IV PUSH (FOR BLOOD PRESSURE SUPPORT)
PREFILLED_SYRINGE | INTRAVENOUS | Status: DC | PRN
  Administered 2019-07-06: 80 ug via INTRAVENOUS
  Administered 2019-07-06: 120 ug via INTRAVENOUS
  Administered 2019-07-06: 80 ug via INTRAVENOUS

## 2019-07-06 MED ORDER — CEFAZOLIN SODIUM-DEXTROSE 2-4 GM/100ML-% IV SOLN
2.0000 g | INTRAVENOUS | Status: AC
  Administered 2019-07-06 (×2): 2 g via INTRAVENOUS
  Filled 2019-07-06: qty 100

## 2019-07-06 MED ORDER — SODIUM CHLORIDE 0.9% FLUSH
3.0000 mL | Freq: Two times a day (BID) | INTRAVENOUS | Status: DC
  Administered 2019-07-06: 3 mL via INTRAVENOUS

## 2019-07-06 MED ORDER — LIDOCAINE 2% (20 MG/ML) 5 ML SYRINGE
INTRAMUSCULAR | Status: DC | PRN
  Administered 2019-07-06: 60 mg via INTRAVENOUS

## 2019-07-06 MED ORDER — OXYCODONE-ACETAMINOPHEN 5-325 MG PO TABS
1.0000 | ORAL_TABLET | ORAL | Status: DC | PRN
  Administered 2019-07-06: 20:00:00 1 via ORAL
  Administered 2019-07-06: 2 via ORAL
  Administered 2019-07-07 (×3): 1 via ORAL
  Filled 2019-07-06: qty 1
  Filled 2019-07-06: qty 2
  Filled 2019-07-06 (×3): qty 1

## 2019-07-06 MED ORDER — SENNOSIDES-DOCUSATE SODIUM 8.6-50 MG PO TABS: 1.0000 | ORAL_TABLET | Freq: Every evening | ORAL | Status: DC | PRN

## 2019-07-06 MED ORDER — BUPIVACAINE LIPOSOME 1.3 % IJ SUSP
20.0000 mL | Freq: Once | INTRAMUSCULAR | Status: DC
  Filled 2019-07-06: qty 20

## 2019-07-06 MED ORDER — ACETAMINOPHEN 650 MG RE SUPP: 650.0000 mg | RECTAL | Status: DC | PRN

## 2019-07-06 MED ORDER — DIAZEPAM 5 MG PO TABS
5.0000 mg | ORAL_TABLET | Freq: Four times a day (QID) | ORAL | Status: DC | PRN
  Administered 2019-07-06 – 2019-07-07 (×3): 5 mg via ORAL
  Filled 2019-07-06 (×2): qty 1

## 2019-07-06 MED ORDER — SODIUM CHLORIDE 0.9 % IV SOLN
INTRAVENOUS | Status: DC | PRN
  Administered 2019-07-06: 15 ug/min via INTRAVENOUS

## 2019-07-06 MED ORDER — OXYCODONE-ACETAMINOPHEN 5-325 MG PO TABS: 1.0000 | ORAL_TABLET | ORAL | 0 refills | Status: AC | PRN

## 2019-07-06 MED ORDER — MIDAZOLAM HCL 2 MG/2ML IJ SOLN
INTRAMUSCULAR | Status: AC
  Filled 2019-07-06: qty 4

## 2019-07-06 MED ORDER — CEFAZOLIN SODIUM-DEXTROSE 2-4 GM/100ML-% IV SOLN
2.0000 g | Freq: Three times a day (TID) | INTRAVENOUS | Status: AC
  Administered 2019-07-06 – 2019-07-07 (×2): 2 g via INTRAVENOUS
  Filled 2019-07-06 (×2): qty 100

## 2019-07-06 MED ORDER — POVIDONE-IODINE 7.5 % EX SOLN: Freq: Once | CUTANEOUS | Status: DC

## 2019-07-06 MED ORDER — SODIUM CHLORIDE 0.9% FLUSH: 3.0000 mL | INTRAVENOUS | Status: DC | PRN

## 2019-07-06 MED ORDER — FENTANYL CITRATE (PF) 250 MCG/5ML IJ SOLN
INTRAMUSCULAR | Status: AC
  Filled 2019-07-06: qty 5

## 2019-07-06 MED ORDER — HYDROMORPHONE HCL 1 MG/ML IJ SOLN
INTRAMUSCULAR | Status: AC
  Filled 2019-07-06: qty 1

## 2019-07-06 MED ORDER — BUPIVACAINE-EPINEPHRINE (PF) 0.25% -1:200000 IJ SOLN
INTRAMUSCULAR | Status: AC
  Filled 2019-07-06: qty 30

## 2019-07-06 MED ORDER — SUGAMMADEX SODIUM 200 MG/2ML IV SOLN
INTRAVENOUS | Status: DC | PRN
  Administered 2019-07-06: 140 mg via INTRAVENOUS

## 2019-07-06 MED ORDER — SUCCINYLCHOLINE CHLORIDE 200 MG/10ML IV SOSY
PREFILLED_SYRINGE | INTRAVENOUS | Status: AC
  Filled 2019-07-06: qty 50

## 2019-07-06 MED ORDER — THROMBIN (RECOMBINANT) 20000 UNITS EX SOLR
CUTANEOUS | Status: AC
  Filled 2019-07-06: qty 20000

## 2019-07-06 MED ORDER — KETAMINE HCL 10 MG/ML IJ SOLN
INTRAMUSCULAR | Status: DC | PRN
  Administered 2019-07-06: 20 mg via INTRAVENOUS
  Administered 2019-07-06: 10 mg via INTRAVENOUS
  Administered 2019-07-06: 20 mg via INTRAVENOUS

## 2019-07-06 MED ORDER — HYDROMORPHONE HCL 1 MG/ML IJ SOLN
0.2500 mg | INTRAMUSCULAR | Status: DC | PRN
  Administered 2019-07-06 (×2): 0.5 mg via INTRAVENOUS

## 2019-07-06 MED ORDER — BUPIVACAINE LIPOSOME 1.3 % IJ SUSP
INTRAMUSCULAR | Status: DC | PRN
  Administered 2019-07-06: 20 mL

## 2019-07-06 MED ORDER — MIDAZOLAM HCL 2 MG/2ML IJ SOLN
INTRAMUSCULAR | Status: DC | PRN
  Administered 2019-07-06 (×2): 2 mg via INTRAVENOUS

## 2019-07-06 MED ORDER — ROCURONIUM BROMIDE 10 MG/ML (PF) SYRINGE
PREFILLED_SYRINGE | INTRAVENOUS | Status: AC
  Filled 2019-07-06: qty 30

## 2019-07-06 MED ORDER — CHLORHEXIDINE GLUCONATE 4 % EX LIQD: 60.0000 mL | Freq: Once | CUTANEOUS | Status: DC

## 2019-07-06 MED ORDER — DEXAMETHASONE SODIUM PHOSPHATE 10 MG/ML IJ SOLN
INTRAMUSCULAR | Status: AC
  Filled 2019-07-06: qty 4

## 2019-07-06 MED ORDER — DEXAMETHASONE SODIUM PHOSPHATE 10 MG/ML IJ SOLN
INTRAMUSCULAR | Status: DC | PRN
  Administered 2019-07-06: 10 mg via INTRAVENOUS

## 2019-07-06 MED ORDER — KETAMINE HCL 50 MG/5ML IJ SOSY
PREFILLED_SYRINGE | INTRAMUSCULAR | Status: AC
  Filled 2019-07-06: qty 5

## 2019-07-06 MED ORDER — PROPOFOL 10 MG/ML IV BOLUS
INTRAVENOUS | Status: AC
  Filled 2019-07-06: qty 40

## 2019-07-06 MED ORDER — FENTANYL CITRATE (PF) 100 MCG/2ML IJ SOLN
INTRAMUSCULAR | Status: DC | PRN
  Administered 2019-07-06: 200 ug via INTRAVENOUS
  Administered 2019-07-06 (×3): 50 ug via INTRAVENOUS

## 2019-07-06 MED ORDER — PHENYLEPHRINE 40 MCG/ML (10ML) SYRINGE FOR IV PUSH (FOR BLOOD PRESSURE SUPPORT)
PREFILLED_SYRINGE | INTRAVENOUS | Status: AC
  Filled 2019-07-06: qty 10

## 2019-07-06 MED ORDER — MORPHINE SULFATE (PF) 2 MG/ML IV SOLN: 1.0000 mg | INTRAVENOUS | Status: DC | PRN

## 2019-07-06 MED ORDER — DIAZEPAM 5 MG PO TABS: 5.0000 mg | ORAL_TABLET | Freq: Four times a day (QID) | ORAL | 0 refills | Status: AC | PRN

## 2019-07-06 MED ORDER — ZOLPIDEM TARTRATE 5 MG PO TABS: 5.0000 mg | ORAL_TABLET | Freq: Every evening | ORAL | Status: DC | PRN

## 2019-07-06 MED ORDER — DIAZEPAM 5 MG PO TABS
ORAL_TABLET | ORAL | Status: AC
  Filled 2019-07-06: qty 1

## 2019-07-06 MED ORDER — METHYLENE BLUE 0.5 % INJ SOLN
INTRAVENOUS | Status: AC
  Filled 2019-07-06: qty 10

## 2019-07-06 MED ORDER — BISACODYL 5 MG PO TBEC: 5.0000 mg | DELAYED_RELEASE_TABLET | Freq: Every day | ORAL | Status: DC | PRN

## 2019-07-06 MED ORDER — SUCCINYLCHOLINE CHLORIDE 200 MG/10ML IV SOSY
PREFILLED_SYRINGE | INTRAVENOUS | Status: DC | PRN
  Administered 2019-07-06: 100 mg via INTRAVENOUS

## 2019-07-06 MED ORDER — FLEET ENEMA 7-19 GM/118ML RE ENEM: 1.0000 | ENEMA | Freq: Once | RECTAL | Status: DC | PRN

## 2019-07-06 MED ORDER — ONDANSETRON HCL 4 MG/2ML IJ SOLN
INTRAMUSCULAR | Status: AC
  Filled 2019-07-06: qty 8

## 2019-07-06 MED ORDER — LACTATED RINGERS IV SOLN
INTRAVENOUS | Status: DC | PRN
  Administered 2019-07-06 (×2): via INTRAVENOUS

## 2019-07-06 MED ORDER — ACETAMINOPHEN 500 MG PO TABS
1000.0000 mg | ORAL_TABLET | Freq: Once | ORAL | Status: AC
  Administered 2019-07-06: 07:00:00 1000 mg via ORAL
  Filled 2019-07-06: qty 2

## 2019-07-06 MED ORDER — ACETAMINOPHEN 325 MG PO TABS: 650.0000 mg | ORAL_TABLET | ORAL | Status: DC | PRN

## 2019-07-06 MED ORDER — SODIUM CHLORIDE 0.9 % IV SOLN
250.0000 mL | INTRAVENOUS | Status: DC
  Administered 2019-07-06: 16:00:00 250 mL via INTRAVENOUS

## 2019-07-06 MED ORDER — PROPOFOL 10 MG/ML IV BOLUS
INTRAVENOUS | Status: DC | PRN
  Administered 2019-07-06: 200 mg via INTRAVENOUS

## 2019-07-06 MED ORDER — ROCURONIUM BROMIDE 10 MG/ML (PF) SYRINGE
PREFILLED_SYRINGE | INTRAVENOUS | Status: DC | PRN
  Administered 2019-07-06: 60 mg via INTRAVENOUS
  Administered 2019-07-06 (×2): 10 mg via INTRAVENOUS

## 2019-07-06 MED ORDER — ONDANSETRON HCL 4 MG/2ML IJ SOLN
INTRAMUSCULAR | Status: DC | PRN
  Administered 2019-07-06: 4 mg via INTRAVENOUS

## 2019-07-06 MED ORDER — ONDANSETRON HCL 4 MG/2ML IJ SOLN: 4.0000 mg | Freq: Four times a day (QID) | INTRAMUSCULAR | Status: DC | PRN

## 2019-07-06 MED ORDER — MENTHOL 3 MG MT LOZG: 1.0000 | LOZENGE | OROMUCOSAL | Status: DC | PRN

## 2019-07-06 MED ORDER — LIDOCAINE 2% (20 MG/ML) 5 ML SYRINGE
INTRAMUSCULAR | Status: AC
  Filled 2019-07-06: qty 25

## 2019-07-06 MED ORDER — BUPIVACAINE-EPINEPHRINE (PF) 0.25% -1:200000 IJ SOLN
INTRAMUSCULAR | Status: DC | PRN
  Administered 2019-07-06: 30 mL

## 2019-07-06 MED ORDER — PHENOL 1.4 % MT LIQD: 1.0000 | OROMUCOSAL | Status: DC | PRN

## 2019-07-06 MED ORDER — ALUM & MAG HYDROXIDE-SIMETH 200-200-20 MG/5ML PO SUSP
30.0000 mL | Freq: Four times a day (QID) | ORAL | Status: DC | PRN
  Filled 2019-07-06: qty 30

## 2019-07-06 SURGICAL SUPPLY — 103 items
APPLIER CLIP 11 MED OPEN (CLIP) ×8
BENZOIN TINCTURE PRP APPL 2/3 (GAUZE/BANDAGES/DRESSINGS) ×8 IMPLANT
BLADE CLIPPER SURG (BLADE) IMPLANT
BLADE SURG 10 STRL SS (BLADE) ×4 IMPLANT
BONE VIVIGEN FORMABLE 10CC (Bone Implant) ×4 IMPLANT
CLIP APPLIE 11 MED OPEN (CLIP) ×4 IMPLANT
CLIP LIGATING EXTRA MED SLVR (CLIP) ×4 IMPLANT
CLIP LIGATING EXTRA SM BLUE (MISCELLANEOUS) ×4 IMPLANT
CLOSURE STERI-STRIP 1/2X4 (GAUZE/BANDAGES/DRESSINGS) ×2
CLOSURE WOUND 1/2 X4 (GAUZE/BANDAGES/DRESSINGS)
CLSR STERI-STRIP ANTIMIC 1/2X4 (GAUZE/BANDAGES/DRESSINGS) ×6 IMPLANT
CORD BIPOLAR FORCEPS 12FT (ELECTRODE) ×4 IMPLANT
COVER SURGICAL LIGHT HANDLE (MISCELLANEOUS) ×4 IMPLANT
COVER WAND RF STERILE (DRAPES) ×8 IMPLANT
DERMABOND ADVANCED (GAUZE/BANDAGES/DRESSINGS) ×2
DERMABOND ADVANCED .7 DNX12 (GAUZE/BANDAGES/DRESSINGS) ×2 IMPLANT
DRAPE C-ARM 42X72 X-RAY (DRAPES) ×8 IMPLANT
DRAPE C-ARMOR (DRAPES) ×8 IMPLANT
DRAPE POUCH INSTRU U-SHP 10X18 (DRAPES) ×4 IMPLANT
DRAPE SURG 17X23 STRL (DRAPES) ×12 IMPLANT
DRSG MEPILEX BORDER 4X12 (GAUZE/BANDAGES/DRESSINGS) ×4 IMPLANT
DRSG MEPILEX BORDER 4X8 (GAUZE/BANDAGES/DRESSINGS) ×4 IMPLANT
DURAPREP 26ML APPLICATOR (WOUND CARE) ×4 IMPLANT
ELECT BLADE 4.0 EZ CLEAN MEGAD (MISCELLANEOUS) ×8
ELECT CAUTERY BLADE 6.4 (BLADE) ×4 IMPLANT
ELECT REM PT RETURN 9FT ADLT (ELECTROSURGICAL) ×4
ELECTRODE BLDE 4.0 EZ CLN MEGD (MISCELLANEOUS) ×4 IMPLANT
ELECTRODE REM PT RTRN 9FT ADLT (ELECTROSURGICAL) ×2 IMPLANT
FEE INTRAOP MONITOR IMPULS NCS (MISCELLANEOUS) ×2 IMPLANT
GAUZE 4X4 16PLY RFD (DISPOSABLE) IMPLANT
GAUZE SPONGE 4X4 12PLY STRL (GAUZE/BANDAGES/DRESSINGS) ×8 IMPLANT
GLOVE BIO SURGEON STRL SZ7 (GLOVE) ×4 IMPLANT
GLOVE BIO SURGEON STRL SZ8 (GLOVE) ×4 IMPLANT
GLOVE BIOGEL PI IND STRL 7.0 (GLOVE) ×2 IMPLANT
GLOVE BIOGEL PI IND STRL 8 (GLOVE) ×4 IMPLANT
GLOVE BIOGEL PI INDICATOR 7.0 (GLOVE) ×2
GLOVE BIOGEL PI INDICATOR 8 (GLOVE) ×4
GLOVE SS BIOGEL STRL SZ 7.5 (GLOVE) ×2 IMPLANT
GLOVE SUPERSENSE BIOGEL SZ 7.5 (GLOVE) ×2
GOWN STRL REUS W/ TWL LRG LVL3 (GOWN DISPOSABLE) ×6 IMPLANT
GOWN STRL REUS W/ TWL XL LVL3 (GOWN DISPOSABLE) ×2 IMPLANT
GOWN STRL REUS W/TWL LRG LVL3 (GOWN DISPOSABLE) ×6
GOWN STRL REUS W/TWL XL LVL3 (GOWN DISPOSABLE) ×2
GUIDEWIRE BLUNT VIPER II 1.45 (WIRE) ×8 IMPLANT
GUIDEWIRE SHARP VIPER II (WIRE) ×16 IMPLANT
HEMOSTAT SURGICEL 2X14 (HEMOSTASIS) IMPLANT
INSERT FOGARTY 61MM (MISCELLANEOUS) IMPLANT
INSERT FOGARTY SM (MISCELLANEOUS) IMPLANT
INTRAOP MONITOR FEE IMPULS NCS (MISCELLANEOUS) ×2
INTRAOP MONITOR FEE IMPULSE (MISCELLANEOUS) ×2
KIT ALARA NEURO ACCESS (KITS) ×8 IMPLANT
KIT BASIN OR (CUSTOM PROCEDURE TRAY) ×4 IMPLANT
KIT TURNOVER KIT B (KITS) ×4 IMPLANT
LOOP VESSEL MAXI BLUE (MISCELLANEOUS) IMPLANT
LOOP VESSEL MINI RED (MISCELLANEOUS) IMPLANT
MARKER SKIN DUAL TIP RULER LAB (MISCELLANEOUS) ×4 IMPLANT
NEEDLE HYPO 25GX1X1/2 BEV (NEEDLE) ×4 IMPLANT
NEEDLE SPNL 18GX3.5 QUINCKE PK (NEEDLE) ×4 IMPLANT
NS IRRIG 1000ML POUR BTL (IV SOLUTION) ×4 IMPLANT
PACK LAMINECTOMY ORTHO (CUSTOM PROCEDURE TRAY) ×4 IMPLANT
PACK UNIVERSAL I (CUSTOM PROCEDURE TRAY) ×4 IMPLANT
PAD ARMBOARD 7.5X6 YLW CONV (MISCELLANEOUS) ×16 IMPLANT
PATTIES SURGICAL .5 X1 (DISPOSABLE) ×4 IMPLANT
PROBE PEDCLE PROBE MAGSTM DISP (MISCELLANEOUS) ×4 IMPLANT
ROD VIPER2 5.5X45 PRE LARDOSED (Rod) ×8 IMPLANT
SCREW CANCELLOUS 6.5 32MM (Screw) ×4 IMPLANT
SCREW POLY VIPER MIS 7X40MM (Screw) ×16 IMPLANT
SCREW SET SINGLE INNER MIS (Screw) ×16 IMPLANT
SPONGE INTESTINAL PEANUT (DISPOSABLE) ×12 IMPLANT
SPONGE LAP 18X18 RF (DISPOSABLE) IMPLANT
SPONGE LAP 4X18 RFD (DISPOSABLE) IMPLANT
SPONGE SURGIFOAM ABS GEL 100 (HEMOSTASIS) ×8 IMPLANT
STAPLER VISISTAT 35W (STAPLE) IMPLANT
STRIP CLOSURE SKIN 1/2X4 (GAUZE/BANDAGES/DRESSINGS) IMPLANT
SURGIFLO W/THROMBIN 8M KIT (HEMOSTASIS) IMPLANT
SUT MNCRL AB 4-0 PS2 18 (SUTURE) ×8 IMPLANT
SUT PDS AB 1 CTX 36 (SUTURE) ×8 IMPLANT
SUT PROLENE 4 0 RB 1 (SUTURE)
SUT PROLENE 4-0 RB1 .5 CRCL 36 (SUTURE) IMPLANT
SUT PROLENE 5 0 C 1 24 (SUTURE) IMPLANT
SUT PROLENE 5 0 CC1 (SUTURE) IMPLANT
SUT PROLENE 6 0 C 1 30 (SUTURE) ×4 IMPLANT
SUT PROLENE 6 0 CC (SUTURE) IMPLANT
SUT SILK 0 TIES 10X30 (SUTURE) ×4 IMPLANT
SUT SILK 2 0 TIES 10X30 (SUTURE) ×8 IMPLANT
SUT SILK 2 0SH CR/8 30 (SUTURE) IMPLANT
SUT SILK 3 0 TIES 10X30 (SUTURE) ×8 IMPLANT
SUT SILK 3 0SH CR/8 30 (SUTURE) IMPLANT
SUT VIC AB 1 CT1 27 (SUTURE) ×4
SUT VIC AB 1 CT1 27XBRD ANBCTR (SUTURE) ×4 IMPLANT
SUT VIC AB 1 CTX 36 (SUTURE) ×4
SUT VIC AB 1 CTX36XBRD ANBCTR (SUTURE) ×4 IMPLANT
SUT VIC AB 2-0 CT2 18 VCP726D (SUTURE) ×4 IMPLANT
SYNCAGE EVOL MD 12X8.5 10D (Spacer) ×8 IMPLANT
SYR BULB IRRIGATION 50ML (SYRINGE) ×4 IMPLANT
TAP CANN VIPER2 DL 6.0 (TAP) ×8 IMPLANT
TAPE CLOTH SURG 6X10 WHT LF (GAUZE/BANDAGES/DRESSINGS) ×8 IMPLANT
TOWEL GREEN STERILE (TOWEL DISPOSABLE) ×8 IMPLANT
TOWEL GREEN STERILE FF (TOWEL DISPOSABLE) ×4 IMPLANT
TRAY FOLEY CATH SILVER 16FR (SET/KITS/TRAYS/PACK) ×4 IMPLANT
WASHER 13.0MM (Orthopedic Implant) ×4 IMPLANT
WATER STERILE IRR 1000ML POUR (IV SOLUTION) ×4 IMPLANT
YANKAUER SUCT BULB TIP NO VENT (SUCTIONS) ×4 IMPLANT

## 2019-07-06 NOTE — OR Nursing (Signed)
Abdominal X-Ray taken in lieu of instrument count for abdominal closure.  No retained items found on films, verified by Dr. Jobe Igo @ [phone]

## 2019-07-06 NOTE — Transfer of Care (Signed)
Immediate Anesthesia Transfer of Care Note  Patient: Tyler [name]  Procedure(s) Performed: ANTERIOR LUMBAR INTERBODY FUSION WITH POSTERIOR SPINAL FUSION , LUMBAR 5 - SACRUM 1 WITH INSTRUMENTATION AND ALLOGRAFT (N/A ) Posterior Lumbar Fusion 1 Level (N/A Spine Lumbar) ABDOMINAL EXPOSURE (N/A )  Patient Location: PACU  Anesthesia Type:General  Level of Consciousness: sedated  Airway & Oxygen Therapy: Patient Spontanous Breathing, Patient connected to nasal cannula oxygen and oral airway  Post-op Assessment: Report given to RN and Post -op Vital signs reviewed and stable  Post vital signs: Reviewed and stable  Last Vitals:  Vitals Value Taken Time  BP 107/59 07/06/19 1239  Temp    Pulse 89 07/06/19 1241  Resp 17 07/06/19 1241  SpO2 99 % 07/06/19 1241  Vitals shown include unvalidated device data.  Last Pain:  Vitals:   07/06/19 0628  TempSrc:   PainSc: 0-No pain         Complications: No apparent anesthesia complications

## 2019-07-06 NOTE — Progress Notes (Signed)
Orthopedic Tech Progress Note Patient Details:  Tyler [name] February 22, 1995 [id number] Called [employer] for tlso. Patient ID: Tyler [name], male   DOB: [birth year], 24 y.o.   MRN: [id number]   Melony Overly T 07/06/2019, 2:23 PM

## 2019-07-06 NOTE — Anesthesia Postprocedure Evaluation (Signed)
Anesthesia Post Note  Patient: Tyler [name]  Procedure(s) Performed: ANTERIOR LUMBAR INTERBODY FUSION WITH POSTERIOR SPINAL FUSION , LUMBAR 5 - SACRUM 1 WITH INSTRUMENTATION AND ALLOGRAFT (N/A ) Posterior Lumbar Fusion 1 Level (N/A Spine Lumbar) ABDOMINAL EXPOSURE (N/A )     Patient location during evaluation: PACU Anesthesia Type: General Level of consciousness: awake and alert Pain management: pain level controlled Vital Signs Assessment: post-procedure vital signs reviewed and stable Respiratory status: spontaneous breathing, nonlabored ventilation and respiratory function stable Cardiovascular status: blood pressure returned to baseline and stable Postop Assessment: no apparent nausea or vomiting Anesthetic complications: no    Last Vitals:  Vitals:   07/06/19 1325 07/06/19 1340  BP: 118/74 116/78  Pulse: 78 72  Resp: 13 12  Temp:  36.6 C  SpO2: 100% 100%    Last Pain:  Vitals:   07/06/19 1340  TempSrc:   PainSc: 3     LLE Motor Response: Purposeful movement;Responds to commands (07/06/19 1340) LLE Sensation: Full sensation (07/06/19 1340) RLE Motor Response: Purposeful movement;Responds to commands (07/06/19 1340) RLE Sensation: Full sensation (07/06/19 1340)      Jp Eastham,W. EDMOND

## 2019-07-06 NOTE — Op Note (Signed)
    OPERATIVE REPORT  DATE OF SURGERY: 07/06/2019  PATIENT: Tyler [name], 24 y.o. male MRN: [id number]  DOB: 01/14/1995  PRE-OPERATIVE DIAGNOSIS: Degenerative disc disease  POST-OPERATIVE DIAGNOSIS:  Same  PROCEDURE: Anterior exposure for L5-S1 disc surgery  SURGEON:  Curt Jews, M.D.  Co-surgeon for the exposure Dr. Phylliss Bob  PHYSICIAN ASSISTANT: Anice Paganini  ANESTHESIA: General  EBL: per anesthesia record  Total I/O In: 3000 [I.V.:3000] Out: 250 [Urine:100; Blood:150]  BLOOD ADMINISTERED: none  DRAINS: none  SPECIMEN: none  COUNTS CORRECT:  YES  PATIENT DISPOSITION:  PACU - hemodynamically stable  PROCEDURE DETAILS: Patient was taken operating placed supine position with area of the abdomen from draped in sterile fashion.  Crosstable lateral C arm was used to identify the level of the L5-S1 disc.  Incision was made in the left lower quadrant from the midline laterally and carried down through the subcutaneous fat with electrocautery.  The anterior rectus sheath was opened in line with the skin incision and the rectus muscle was mobilized circumferentially.  Retroperitoneum was entered bluntly below the level of the semilunar line.  Blunt dissection was used to mobilize intraperitoneal contents to the right.  The posterior rectus sheath was opened laterally.  Blunt dissection was continued above the level of the psoas muscle to the level of the iliac vessels.  The L5-S1 disc was exposed between the level of the iliac vessels.  Overlying tissue was mobilized with blunt dissection.  The middle sacral vessels were clipped and divided.  The [name] retractor was brought onto the field.  A reverse lip 120 blades were positioned to the right and left of the L5-S1 disc and 140 malleable retractors were placed superior and inferiorly.  Needle was placed in the L5-S1 disc and C-arm was brought back to confirm this was the appropriate location.  The remainder the  procedure be dictated as a separate note by Dr. Levora Dredge, M.D., [facility] 07/06/2019 12:18 PM

## 2019-07-06 NOTE — Anesthesia Procedure Notes (Signed)
Procedure Name: Intubation Date/Time: 07/06/2019 7:42 AM Performed by: Leonor Liv, CRNA Pre-anesthesia Checklist: Patient identified, Emergency Drugs available, Suction available and Patient being monitored Patient Re-evaluated:Patient Re-evaluated prior to induction Oxygen Delivery Method: Circle System Utilized Preoxygenation: Pre-oxygenation with 100% oxygen Induction Type: IV induction Ventilation: Mask ventilation without difficulty Laryngoscope Size: Mac and 3 Grade View: Grade I Tube type: Oral Tube size: 7.5 mm Number of attempts: 1 Airway Equipment and Method: Stylet and Oral airway Placement Confirmation: ETT inserted through vocal cords under direct vision,  positive ETCO2 and breath sounds checked- equal and bilateral Secured at: 23 cm Tube secured with: Tape Dental Injury: Teeth and Oropharynx as per pre-operative assessment

## 2019-07-06 NOTE — H&P (Signed)
     PREOPERATIVE H&P  Chief Complaint: Low back pain  HPI: Tyler [name] is a 24 y.o. male who presents with ongoing pain in the low back  MRI and x-rays reveal L5/S1 DDD, and workup has strongly suggested discogenic low back pain.  Patient has failed multiple forms of conservative care and continues to have pain (see office notes for additional details regarding the patient's full course of treatment)  Past Medical History:  Diagnosis Date  . DDD (degenerative disc disease), lumbar    L5-S1  . Low back pain    Ongoing Axial, secondary to L5-S1 DDD   Past Surgical History:  Procedure Laterality Date  . WISDOM TOOTH EXTRACTION     Social History   Socioeconomic History  . Marital status: Married    Spouse name: Not on file  . Number of children: Not on file  . Years of education: Not on file  . Highest education level: Not on file  Occupational History  . 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 Financial resource strain: Not on file  . Food insecurity    Worry: Not on file    Inability: Not on file  . Transportation needs    Medical: Not on file    Non-medical: Not on file  Tobacco Use  . Smoking status: Former Smoker    Packs/day: 0.25    Types: Cigarettes    Quit date: 07/01/2019    Years since quitting: 0.0  . Smokeless tobacco: Former Network engineer and Sexual Activity  . Alcohol use: Yes    Comment: occ  . Drug use: No  . Sexual activity: Not on file  Lifestyle  . Physical activity    Days per week: Not on file    Minutes per session: Not on file  . Stress: Not on file  Relationships  . Social Herbalist on phone: Not on file    Gets together: Not on file    Attends religious service: Not on file    Active member of club or organization: Not on file    Attends meetings of clubs or organizations: Not on file    Relationship status: Not on file  Other Topics Concern  . Not on file  Social  History Narrative  . Not on file   Family History  Problem Relation Age of Onset  . Healthy Mother   . Healthy Father    No Known Allergies Prior to Admission medications   Not on File     All other systems have been reviewed and were otherwise negative with the exception of those mentioned in the HPI and as above.  Physical Exam: Vitals:   07/06/19 0553  BP: 116/63  Pulse: 74  Resp: 20  Temp: 97.9 F (36.6 C)  SpO2: 97%    Body mass index is 20.87 kg/m.  General: Alert, no acute distress Cardiovascular: No pedal edema Respiratory: No cyanosis, no use of accessory musculature Skin: No lesions in the area of chief complaint Neurologic: Sensation intact distally Psychiatric: Patient is competent for consent with normal mood and affect Lymphatic: No axillary or cervical lymphadenopathy   Assessment/Plan: LOW BACK PAIN, SECONDARY TO LUMBAR 5 - SACRUM 1 DEGENERATIVE DISC DISEASE Plan for Procedure(s): ANTERIOR LUMBAR INTERBODY FUSION WITH POSTERIOR SPINAL FUSION , LUMBAR 5 - SACRUM 1 WITH INSTRUMENTATION AND ALLOGRAFT   Norva Karvonen, MD 07/06/2019 7:29 AM

## 2019-07-06 NOTE — Op Note (Signed)
PATIENT NAME: Tyler [name]   MEDICAL RECORD NO.:   [id number]    PHYSICIAN:  Keari Miu, MD      DATE OF BIRTH: 11/14/1995   DATE OF PROCEDURE: 07/06/2019                              OPERATIVE REPORT   PREOPERATIVE DIAGNOSES: 1. Axial, discogenic low back pain. 2. L5-S1 degenerative disk disease.  POSTOPERATIVE DIAGNOSES: 1. Axial, discogenic low back pain. 2. L5-S1 degenerative disk disease.  PROCEDURE: 1. Anterior lumbar interbody fusion, L5-S1 (expsure peformed by Dr. Todd Early) 2. Insertion of interbody device x 1 (Syncage intervertebral spacer). 3. Placement of anterior instrumentation, L5-S1. 4. Intraoperative use of fluoroscopy. 5. Posterior spinal fusion, L5-S1. 6. Placement of posterior segmental instrumentation, L5, S1. 7. Use of morselized allograft - [id number] [id number] Houghchumonski, MD  ASSISTANT:  Kayla [phone] with the press-fit of the implant.  I then proceeded with placement of anterior instrumentation at the L5-S1 intervertebral space.  To accomplish this, I did use an awl to prepare the trajectory of anterior vertebral body screw.  A washer was attached to the back end of the screw and did help provide anterior fixation across the L5-S1 intervertebral space.  I was very pleased with the press-fit of the anterior hardware.  I was very pleased with the final AP and lateral fluoroscopic images.  The wound was copiously irrigated.  The fascia was closed using #1 PDS.  The subcutaneous layer was closed using 0 Vicryl followed by 2-0 Vicryl, and the skin was closed using 4-0 Monocryl. Benzoin and Steri-Strips were applied followed by sterile dressing.    The patient was then rolled prone onto a Jackson spinal bed.  The back was then prepped and draped in the usual sterile fashion.  I then made paramedian incisions on the right and left sides, just lateral to the lateral borders of the pedicles from L5-S1.  On the left side, the posterolateral gutter  and posterior elements were identified and exposed and decorticated.  The remainder of the Vivigen was packed into the posterolateral gutter on the left side to aid in the success of the posterior fusion.  I then tapped the L5, and S1 pedicles bilaterally up to a 6 mm tap.  Of note, I did use neurologic monitoring and I did test each of the taps using triggered EMG.  There  was no tap that tested below 15 milliamps.  I then placed 7 x 40 mm screws bilaterally at S1 and 7 x 40 mm screws bilaterally at L5.  Rods were then secured into the tulip heads of the screws bilaterally.  Caps were then placed and a final locking procedure was performed.  I was very pleased with the final AP and lateral fluoroscopic images.  The wound was then copiously irrigated.  On the right and left sides, the fascia was closed using #1 Vicryl.  The subcutaneous layer was closed using 0 Vicryl followed by 2-0 Vicryl, and the skin was then closed using 4-0 Monocryl. Benzoin and Steri-Strips were applied followed by sterile dressing.  All instrument counts were correct at the termination of the procedure. Again, I did use neurologic monitoring throughout the surgery, and there was no abnormal EMG activity noted throughout the surgery.  Of note, Pricilla Holm was my assistant throughout surgery, and did aid in retraction, suctioning, and closure for both the anterior and posterior portions of the procedure.     Phylliss Bob, MD

## 2019-07-07 MED FILL — Sodium Chloride IV Soln 0.9%: INTRAVENOUS | Qty: 1000 | Status: AC

## 2019-07-07 MED FILL — Heparin Sodium (Porcine) Inj 1000 Unit/ML: INTRAMUSCULAR | Qty: 30 | Status: AC

## 2019-07-07 NOTE — Progress Notes (Addendum)
Physical Therapy Evaluation and Discharge  Clinical Impression: Patient evaluated by Physical Therapy with no further acute PT needs identified. All education has been completed and the patient has no further questions. At the time of PT eval pt was able to perform transfers and ambulation with gross modified independence and no AD. Pt was educated on precautions, brace application/wearing schedule, car transfer, and activity progression. See below for any follow-up Physical Therapy or equipment needs. PT is signing off. Thank you for this referral.     07/07/19 0851  PT Visit Information  Last PT Received On 07/07/19  Assistance Needed +1  History of Present Illness Pt is a 24 y/o male who presents s/p ALIF abdominal exposure on 07/06/2019. PMH significant for DDD.  Precautions  Precautions Back  Precaution Booklet Issued Yes (comment)  Precaution Comments Reviewed precautions verbally during functional mobility. OT provided handout.   Required Braces or Orthoses Spinal Brace  Spinal Brace TLSO;Applied in sitting position  Restrictions  Weight Bearing Restrictions No  Home Living  Family/patient expects to be discharged to: Private residence  Living Arrangements Spouse/significant other;Children  Available Help at Discharge Family;Available 24 hours/day  Type of Home House  Home Access Stairs to enter  Entrance Stairs-Number of Steps 5  Entrance Stairs-Rails Right;Left;Can reach both  Home Layout One level  Prior Function  Level of Independence Independent  Communication  Communication No difficulties  Pain Assessment  Pain Assessment Faces  Faces Pain Scale 2  Pain Location Incision site  Pain Descriptors / Indicators Operative site guarding;Discomfort  Pain Intervention(s) Limited activity within patient's tolerance;Monitored during session;Repositioned  Cognition  Arousal/Alertness Awake/alert  Behavior During Therapy WFL for tasks assessed/performed  Overall Cognitive  Status Within Functional Limits for tasks assessed  Lower Extremity Assessment  Lower Extremity Assessment Overall WFL for tasks assessed  Cervical / Trunk Assessment  Cervical / Trunk Assessment Other exceptions  Cervical / Trunk Exceptions s/p surgery  Bed Mobility  Overal bed mobility Modified Independent  General bed mobility comments Bed height raised, HOB flat, and rails lowered to simulate home environment. No assist required. Pt was able to demonstrate proper log roll technique.   Transfers  Overall transfer level Modified independent  Equipment used None  General transfer comment Good control. Pt demonstrated proper hand placement on seated surface for safety.   Ambulation/Gait  Ambulation/Gait assistance Modified independent (Device/Increase time)  Gait Distance (Feet) 800 Feet  Assistive device None  Gait Pattern/deviations Step-through pattern;Decreased stride length;Wide base of support  General Gait Details Guarded with mildly wide BOS. Overall appears stiff. Able to relax UE's down to his sides and demonstrate a slightly more natural arm swing, but prefers to hold to the bar of TLSO.   Gait velocity Decreased  Gait velocity interpretation 1.31 - 2.62 ft/sec, indicative of limited community ambulator  Stairs Yes  Stairs assistance Modified independent (Device/Increase time)  Stair Management One rail Left;Alternating pattern;Step to pattern;Forwards  Number of Stairs 10  General stair comments Alternating step pattern ascending. Step-to pattern descending.   Balance  Overall balance assessment Modified Independent  PT - End of Session  Equipment Utilized During Treatment Back brace  Activity Tolerance Patient tolerated treatment well  Patient left in chair;with call bell/phone within reach  Nurse Communication Mobility status  PT Assessment  PT Recommendation/Assessment Patent does not need any further PT services  PT Visit Diagnosis Pain;Other symptoms and signs  involving the nervous system (R29.898)  Pain - part of body  (back, abdomen)  No  Skilled PT All education completed;Patient is modified independent with all activity/mobility  AM-PAC PT "6 Clicks" Mobility Outcome Measure (Version 2)  Help needed turning from your back to your side while in a flat bed without using bedrails? 4  Help needed moving from lying on your back to sitting on the side of a flat bed without using bedrails? 4  Help needed moving to and from a bed to a chair (including a wheelchair)? 4  Help needed standing up from a chair using your arms (e.g., wheelchair or bedside chair)? 4  Help needed to walk in hospital room? 4  Help needed climbing 3-5 steps with a railing?  4  6 Click Score 24  Consider Recommendation of Discharge To: Home with no services  PT Recommendation  Follow Up Recommendations No PT follow up;Supervision - Intermittent  PT equipment None recommended by PT  Acute Rehab PT Goals  Patient Stated Goal Decrease pain, back to PLOF  PT Goal Formulation All assessment and education complete, DC therapy  PT Time Calculation  PT Start Time (ACUTE ONLY) [id number]  PT Stop Time (ACUTE ONLY) 0826  PT Time Calculation (min) (ACUTE ONLY) 15 min  PT General Charges  $$ ACUTE PT VISIT 1 Visit  PT Evaluation  $PT Eval Low Complexity 1 Low   Conni SlipperLaura Elad Macphail, PT, DPT Acute Rehabilitation Services Pager: [phone] Office: [phone]

## 2019-07-07 NOTE — Progress Notes (Signed)
Patient alert and oriented, mae's well, voiding adequate amount of urine, swallowing without difficulty, no c/o pain at time of discharge. Patient discharged home with family. Script and discharged instructions given to patient. Patient and family stated understanding of instructions given. Patient has an appointment with Dr. Dumonski 

## 2019-07-07 NOTE — Evaluation (Signed)
Occupational Therapy Evaluation Patient Details Name: Tyler EGGENBERGER MRN: [id number] DOB: Mar 09, 1995 Today's Date: 07/07/2019    History of Present Illness Pt is a 24 y/o male who presents s/p ALIF abdominal exposure on 07/06/2019. PMH significant for DDD.   Clinical Impression   Patient evaluated by Occupational Therapy with no further acute OT needs identified. All education has been completed and the patient has no further questions. See below for any follow-up Occupational Therapy or equipment needs. OT to sign off. Thank you for referral.      Follow Up Recommendations  No OT follow up    Equipment Recommendations  Other (comment)(AE kit (reacher grabber sock aide shoe horn))    Recommendations for Other Services       Precautions / Restrictions Precautions Precautions: Back Precaution Booklet Issued: Yes (comment) Precaution Comments: back handout provided and reviewed with patietn regarding adls Required Braces or Orthoses: Spinal Brace Spinal Brace: Thoracolumbosacral orthotic;Applied in sitting position Restrictions Weight Bearing Restrictions: No      Mobility Bed Mobility               General bed mobility comments: oob ona rrival  Transfers Overall transfer level: Modified independent               General transfer comment: educated on scooting to edge of chair and power up    Balance                                           ADL either performed or assessed with clinical judgement   ADL Overall ADL's : Modified independent                                       General ADL Comments: educated on ae use and demonstration during session. return demo of dondoff brace during session    Back handout provided and reviewed adls in detail. Pt educated on: clothing between brace, never sleep in brace, set an alarm at night for medication, avoid sitting for long periods of time, correct bed positioning for  sleeping, correct sequence for bed mobility, avoiding lifting more than 5 pounds and never wash directly over incision. All education is complete and patient indicates understanding.    Vision Baseline Vision/History: No visual deficits       Perception     Praxis      Pertinent Vitals/Pain Pain Assessment: Faces Faces Pain Scale: Hurts a little bit Pain Location: Incision site Pain Descriptors / Indicators: Operative site guarding;Discomfort Pain Intervention(s): Monitored during session;Premedicated before session;Repositioned     Hand Dominance Right   Extremity/Trunk Assessment Upper Extremity Assessment Upper Extremity Assessment: Overall WFL for tasks assessed   Lower Extremity Assessment Lower Extremity Assessment: Overall WFL for tasks assessed   Cervical / Trunk Assessment Cervical / Trunk Assessment: Other exceptions Cervical / Trunk Exceptions: s/p surg   Communication Communication Communication: No difficulties   Cognition Arousal/Alertness: Awake/alert Behavior During Therapy: WFL for tasks assessed/performed Overall Cognitive Status: Within Functional Limits for tasks assessed                                     General Comments       Exercises  Shoulder Instructions      Home Living Family/patient expects to be discharged to:: Private residence Living Arrangements: Spouse/significant other;Children Available Help at Discharge: Family;Available 24 hours/day Type of Home: House Home Access: Stairs to enter [employer] of Steps: 5 Entrance Stairs-Rails: Right;Left;Can reach both Home Layout: One level     Bathroom Shower/Tub: Teacher, early years/pre: Standard     Home Equipment: Bedside commode   Additional Comments: wife willb e home x1 week and then FLMA as needed      Prior Functioning/Environment Level of Independence: Independent                 OT Problem List:        OT  Treatment/Interventions:      OT Goals(Current goals can be found in the care plan section) Acute Rehab OT Goals Patient Stated Goal: to get back to normal  OT Frequency:     Barriers to D/C:            Co-evaluation              AM-PAC OT "6 Clicks" Daily Activity     Outcome Measure Help from another person eating meals?: None Help from another person taking care of personal grooming?: None Help from another person toileting, which includes using toliet, bedpan, or urinal?: None Help from another person bathing (including washing, rinsing, drying)?: None Help from another person to put on and taking off regular upper body clothing?: None Help from another person to put on and taking off regular lower body clothing?: None 6 Click Score: 24   End of Session Equipment Utilized During Treatment: Back brace Nurse Communication: Mobility status;Precautions  Activity Tolerance: Patient tolerated treatment well Patient left: in chair;with call bell/phone within reach;Other (comment)(PT Mickel Baas arriving)  OT Visit Diagnosis: Unsteadiness on feet (R26.81)                Time: 2355-7322 OT Time Calculation (min): 26 min Charges:  OT General Charges $OT Visit: 1 Visit OT Evaluation $OT Eval Moderate Complexity: 1 Mod   Jeri Modena, OTR/L  Acute Rehabilitation Services Pager: [phone] Office: [phone] .   Jeri Modena 07/07/2019, 8:58 AM

## 2019-07-07 NOTE — Progress Notes (Signed)
    Patient doing well  Denies leg pain Minimal back pain Has been ambulating   Physical Exam: Vitals:   07/06/19 2307 07/07/19 0415  BP: (!) 105/59 (!) 100/55  Pulse: 88 81  Resp: 18 18  Temp: 98.6 F (37 C) 98.3 F (36.8 C)  SpO2: 99% 98%    Dressing in place NVI  POD #1 s/p L5/S1 A/P fusion, doing well  - up with PT/OT, encourage ambulation - Percocet for pain, Valium for muscle spasms - likely d/c home today with f/u in 2 weeks

## 2019-07-07 NOTE — Progress Notes (Signed)
Patient ID: Tyler [name], male   DOB: [birth year], 24 y.o.   MRN: [id number] Comfortable.  Has been up walking.  Sitting up eating breakfast with his back brace in place.  Reports minimal abdominal soreness.  Mild back soreness.  Stable postop day 1.  Will not follow actively.

## 2019-07-07 NOTE — Progress Notes (Signed)
CSW spoke with patient. He reported that his workers comp Tourist information centre manager is [employer] [phone]). CSW spoke with her. She requested DC Summary be faxed to [phone]. CSW will send when available. Patient's ride coming at [place] LCSW [phone]

## 2019-07-09 ENCOUNTER — Encounter (HOSPITAL_COMMUNITY): Payer: Self-pay | Admitting: Orthopedic Surgery

## 2019-07-09 NOTE — Discharge Summary (Signed)
Patient ID: Tyler [name] MRN: [id number] DOB/AGE: 01-Jun-1995 24 y.o.  Admit date: 07/06/2019 Discharge date: 07/07/2019  Admission Diagnoses:  Active Problems:   Degenerative disc disease at L5-S1 level   Discharge Diagnoses:  Same  Past Medical History:  Diagnosis Date  . DDD (degenerative disc disease), lumbar    L5-S1  . Low back pain    Ongoing Axial, secondary to L5-S1 DDD    Surgeries: Procedure(s): ANTERIOR LUMBAR INTERBODY FUSION WITH POSTERIOR SPINAL FUSION , LUMBAR 5 - SACRUM 1 WITH INSTRUMENTATION AND ALLOGRAFT ABDOMINAL EXPOSURE Posterior Lumbar Fusion 1 Level on 07/06/2019   Consultants: Treatment Team:  Tyler Posner, MD  Discharged Condition: Improved  Hospital Course: Tyler [name] is an 24 y.o. male who was admitted 07/06/2019 for operative treatment of radiculopathy. Patient has severe unremitting pain that affects sleep, daily activities, and work/hobbies. After pre-op clearance the patient was taken to the operating room on 07/06/2019 and underwent  Procedure(s): ANTERIOR LUMBAR INTERBODY FUSION WITH POSTERIOR SPINAL FUSION , LUMBAR 5 - SACRUM 1 WITH INSTRUMENTATION AND ALLOGRAFT ABDOMINAL EXPOSURE Posterior Lumbar Fusion 1 Level.    Patient was given perioperative antibiotics:  Anti-infectives (From admission, onward)   Start     Dose/Rate Route Frequency Ordered Stop   07/06/19 1930  ceFAZolin (ANCEF) IVPB 2g/100 mL premix     2 g 200 mL/hr over 30 Minutes Intravenous Every 8 hours 07/06/19 1356 07/07/19 0247   07/06/19 0600  ceFAZolin (ANCEF) IVPB 2g/100 mL premix     2 g 200 mL/hr over 30 Minutes Intravenous On call to O.R. 07/06/19 0548 07/06/19 1140       Patient was given sequential compression devices, early ambulation to prevent DVT.  Patient benefited maximally from hospital stay and there were no complications.    Recent vital signs: BP 99/67 (BP Location: Right Arm)   Pulse 75   Temp 98 F (36.7 C) (Oral)   Resp 18    Ht 5' 10.98" (1.803 m)   Wt 67.8 kg   SpO2 100%   BMI 20.87 kg/m    Discharge Medications:   Allergies as of 07/07/2019   No Known Allergies     Medication List    TAKE these medications   diazepam 5 MG tablet Commonly known as: VALIUM Take 1 tablet (5 mg total) by mouth every 6 (six) hours as needed for muscle spasms.   oxyCODONE-acetaminophen 5-325 MG tablet Commonly known as: PERCOCET/ROXICET Take 1-2 tablets by mouth every 4 (four) hours as needed for moderate pain or severe pain.       Diagnostic Studies: Dg Lumbar Spine 2-3 Views  Result Date: 07/06/2019 CLINICAL DATA:  Lumbar fusion. EXAM: LUMBAR SPINE - 2-3 VIEW COMPARISON:  02/24/2018. FINDINGS: Lumbar spine numbered the lowest segmented appearing lumbar shaped vertebrae on lateral view as L5. L5-S1 posterior interbody fusion. Hardware intact. Anatomic alignment. IMPRESSION: L4-L5 posterior and interbody fusion. Electronically Signed   By: Tyler [name]  Register   On: 07/06/2019 13:09   Dg C-arm 1-60 Min  Result Date: 07/06/2019 CLINICAL DATA:  Lumbosacral spine fusion. EXAM: DG C-ARM 61-120 MIN COMPARISON:  Spine radiograph 07/06/2019 FINDINGS: Probable or lateral fluoroscopic images from L5-S1 anterior and posterior fusion demonstrate normal alignment of the metallic hardware in the visualized lumbosacral spine. IMPRESSION: L5-S1 anterior and posterior fusion. Electronically Signed   By: Tyler [name] M.D.   On: 07/06/2019 13:11   Dg Or Local Abdomen  Result Date: 07/06/2019 CLINICAL DATA:  Status post L5-S1 ALIF.  EXAM: OR LOCAL ABDOMEN COMPARISON:  None. FINDINGS: Postsurgical changes of fusion at L5-S1 are noted. Two surgical clips are present over the left hemipelvis. No other radiopaque foreign body is present. The bowel gas pattern is normal. The pelvis is unremarkable. IMPRESSION: 1. No retained surgical instruments. 2. Status post L5-S1 fusion. Electronically Signed   By: Tyler [name]  Mattern M.D.   On: 07/06/2019  10:15    Disposition:    POD #1 s/p L5/S1 A/P fusion, doing well  - up with PT/OT, encourage ambulation - Percocet for pain, Valium for muscle spasms, sent electronically to pharm - d/c home today with f/u in 2 weeks   Signed: Eilene GhaziKayla J Tichina [name] 07/09/2019, 10:52 AM

## 2019-08-08 ENCOUNTER — Encounter (HOSPITAL_COMMUNITY): Payer: Self-pay | Admitting: Orthopedic Surgery

## 2019-08-08 MED ORDER — THROMBIN 20000 UNITS EX SOLR
CUTANEOUS | Status: DC | PRN
  Administered 2019-08-08: 20000 [IU] via TOPICAL

## 2019-11-07 IMAGING — CR DG LUMBAR SPINE COMPLETE 4+V
5 series · 5 of 5 positions shown · non-contrast
Comparison: None.

CLINICAL DATA: Back pain

EXAM:
LUMBAR SPINE - COMPLETE 4+ VIEW

[t l-spine a.p.]
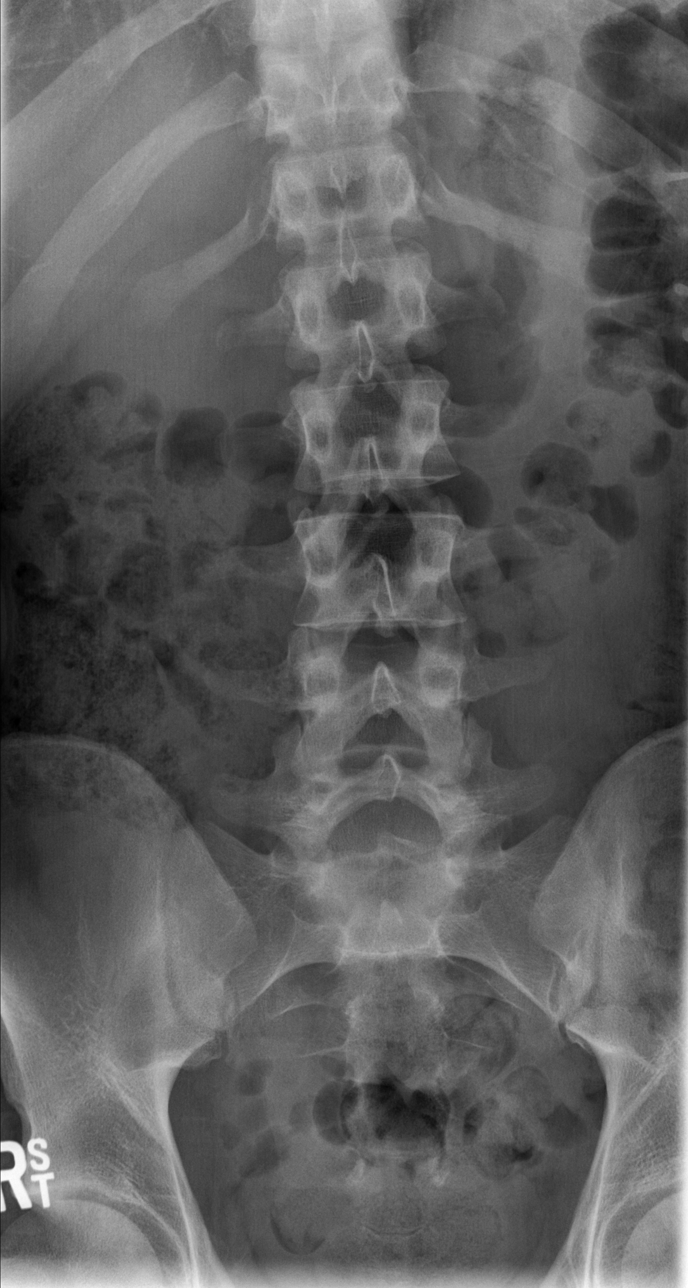

[t l-spine oblique exposure (1 of 2)]
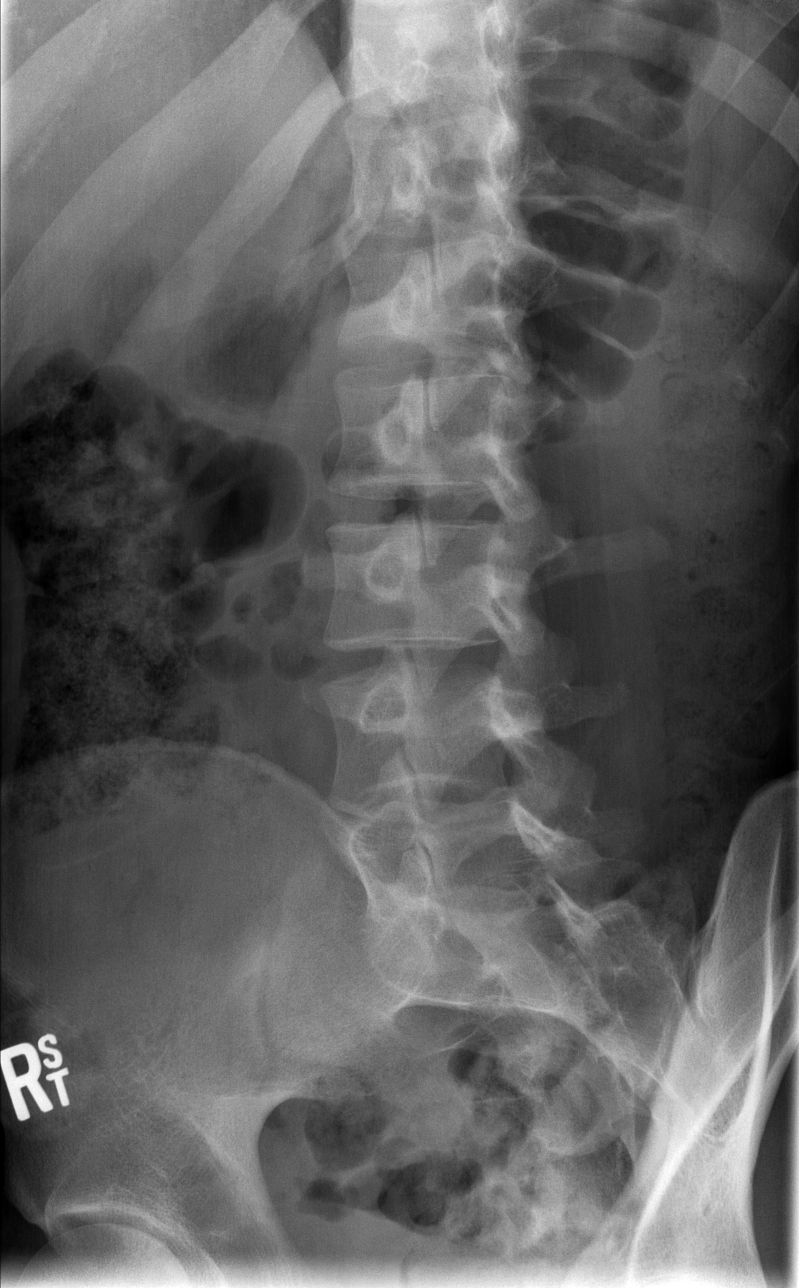

[t l-spine oblique exposure (2 of 2)]
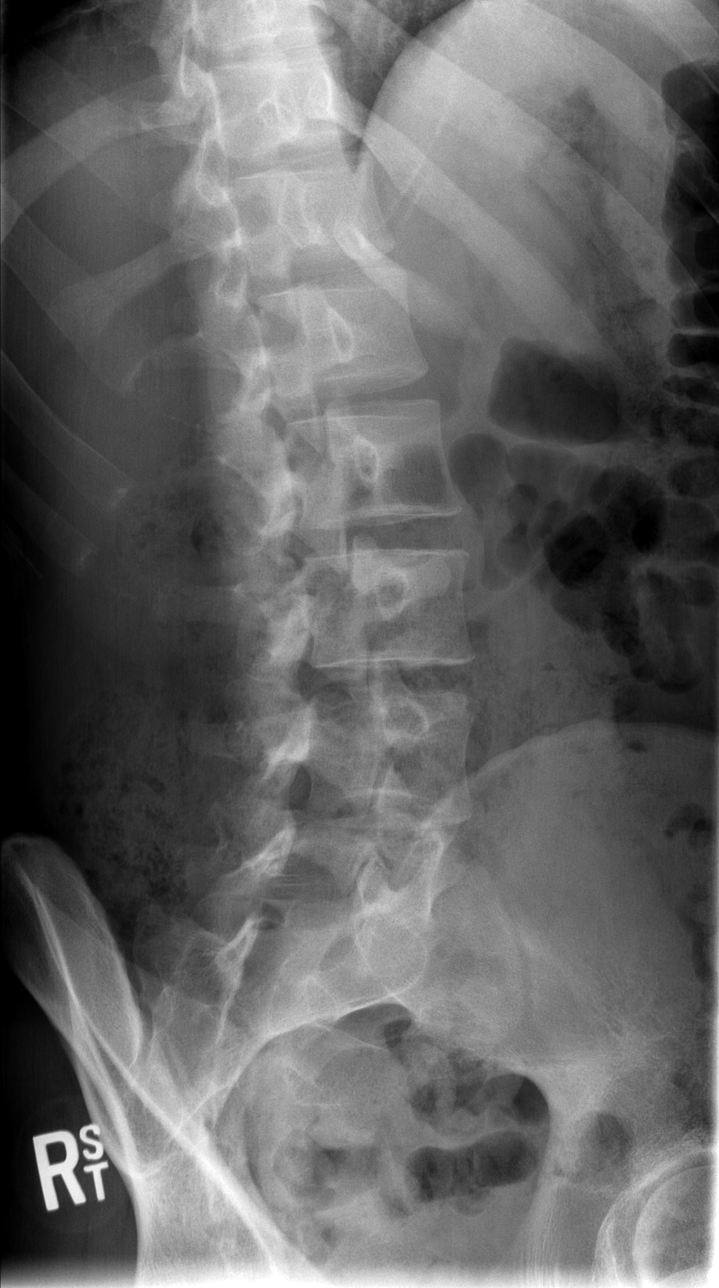

[t l-spine lat]
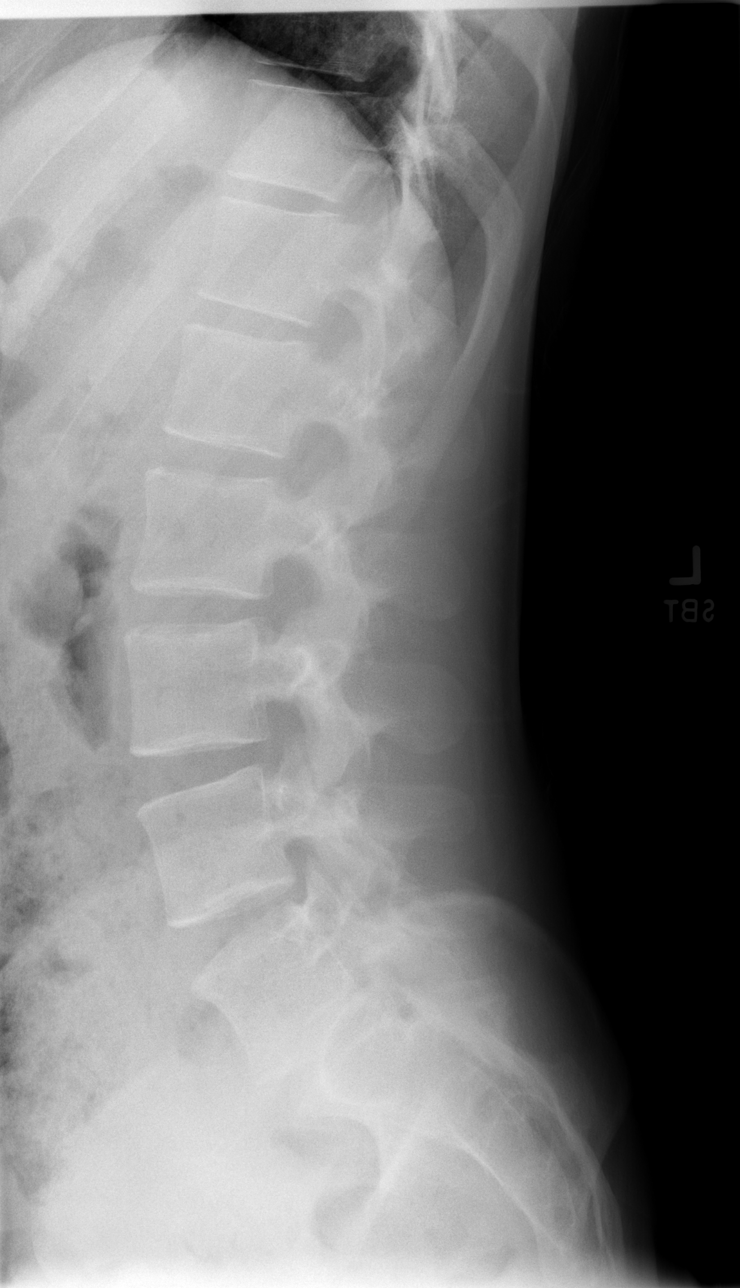

[t l-spine l5-s1 spot]
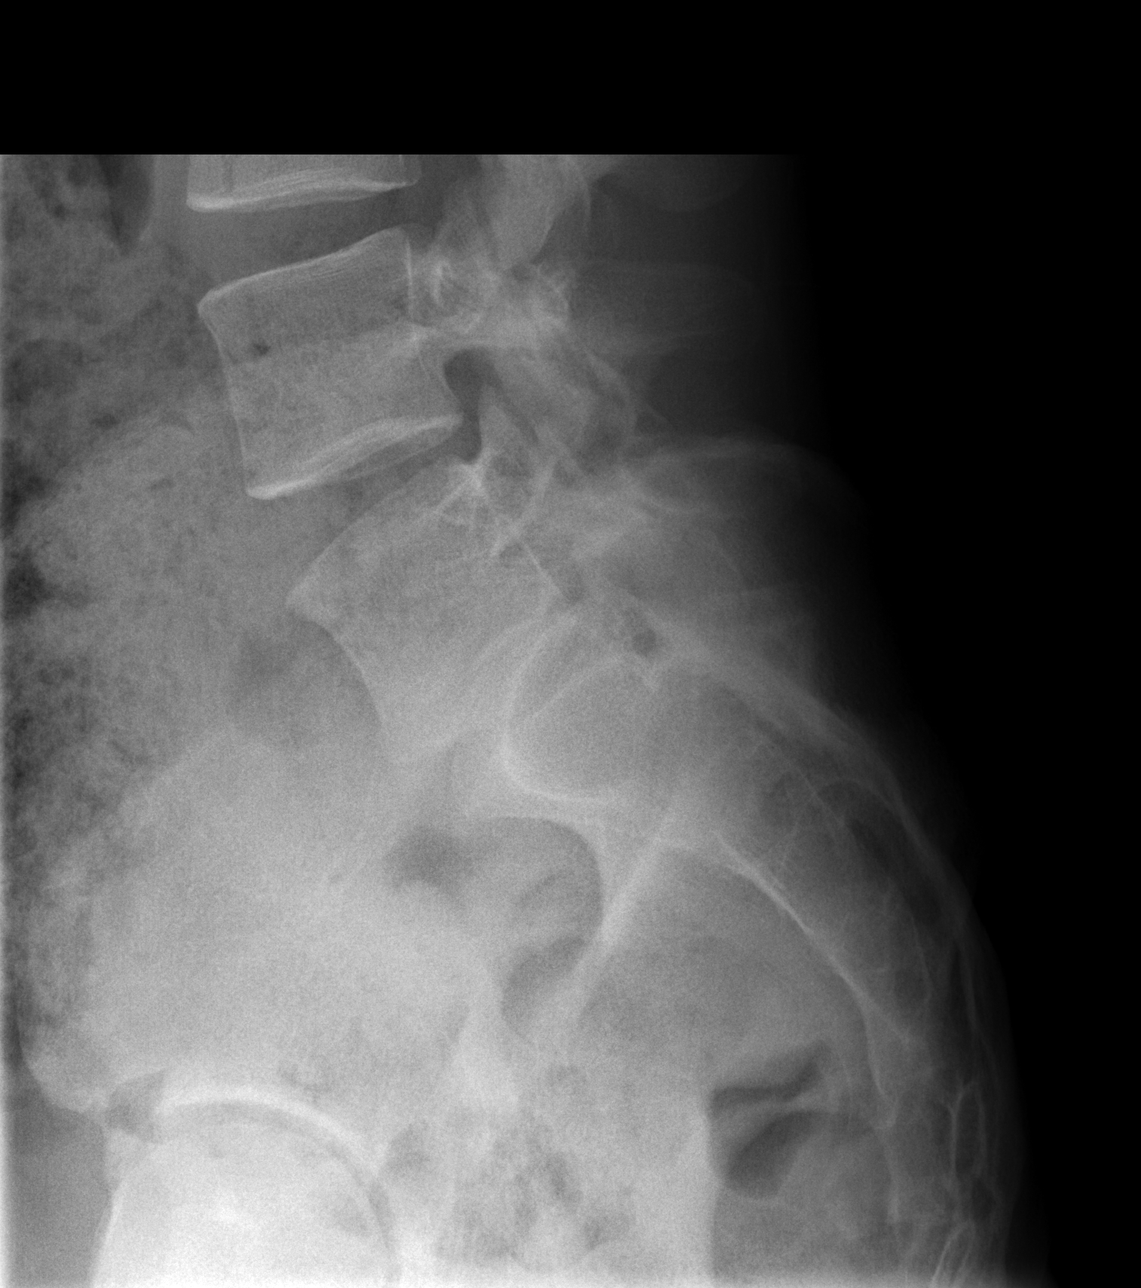

[5 of 5 positions shown; findings below may reference images not displayed]

FINDINGS: There is no evidence of lumbar spine fracture. Alignment is normal.
Minimal disc space narrowing at L5-S1.
IMPRESSION: No acute osseous abnormality

## 2021-03-18 IMAGING — CR OR LOCAL ABDOMEN
1 series · 1 of 1 positions shown · non-contrast
Comparison: None.

CLINICAL DATA: Status post L5-S1 ALIF.

EXAM:
OR LOCAL ABDOMEN

[AP]
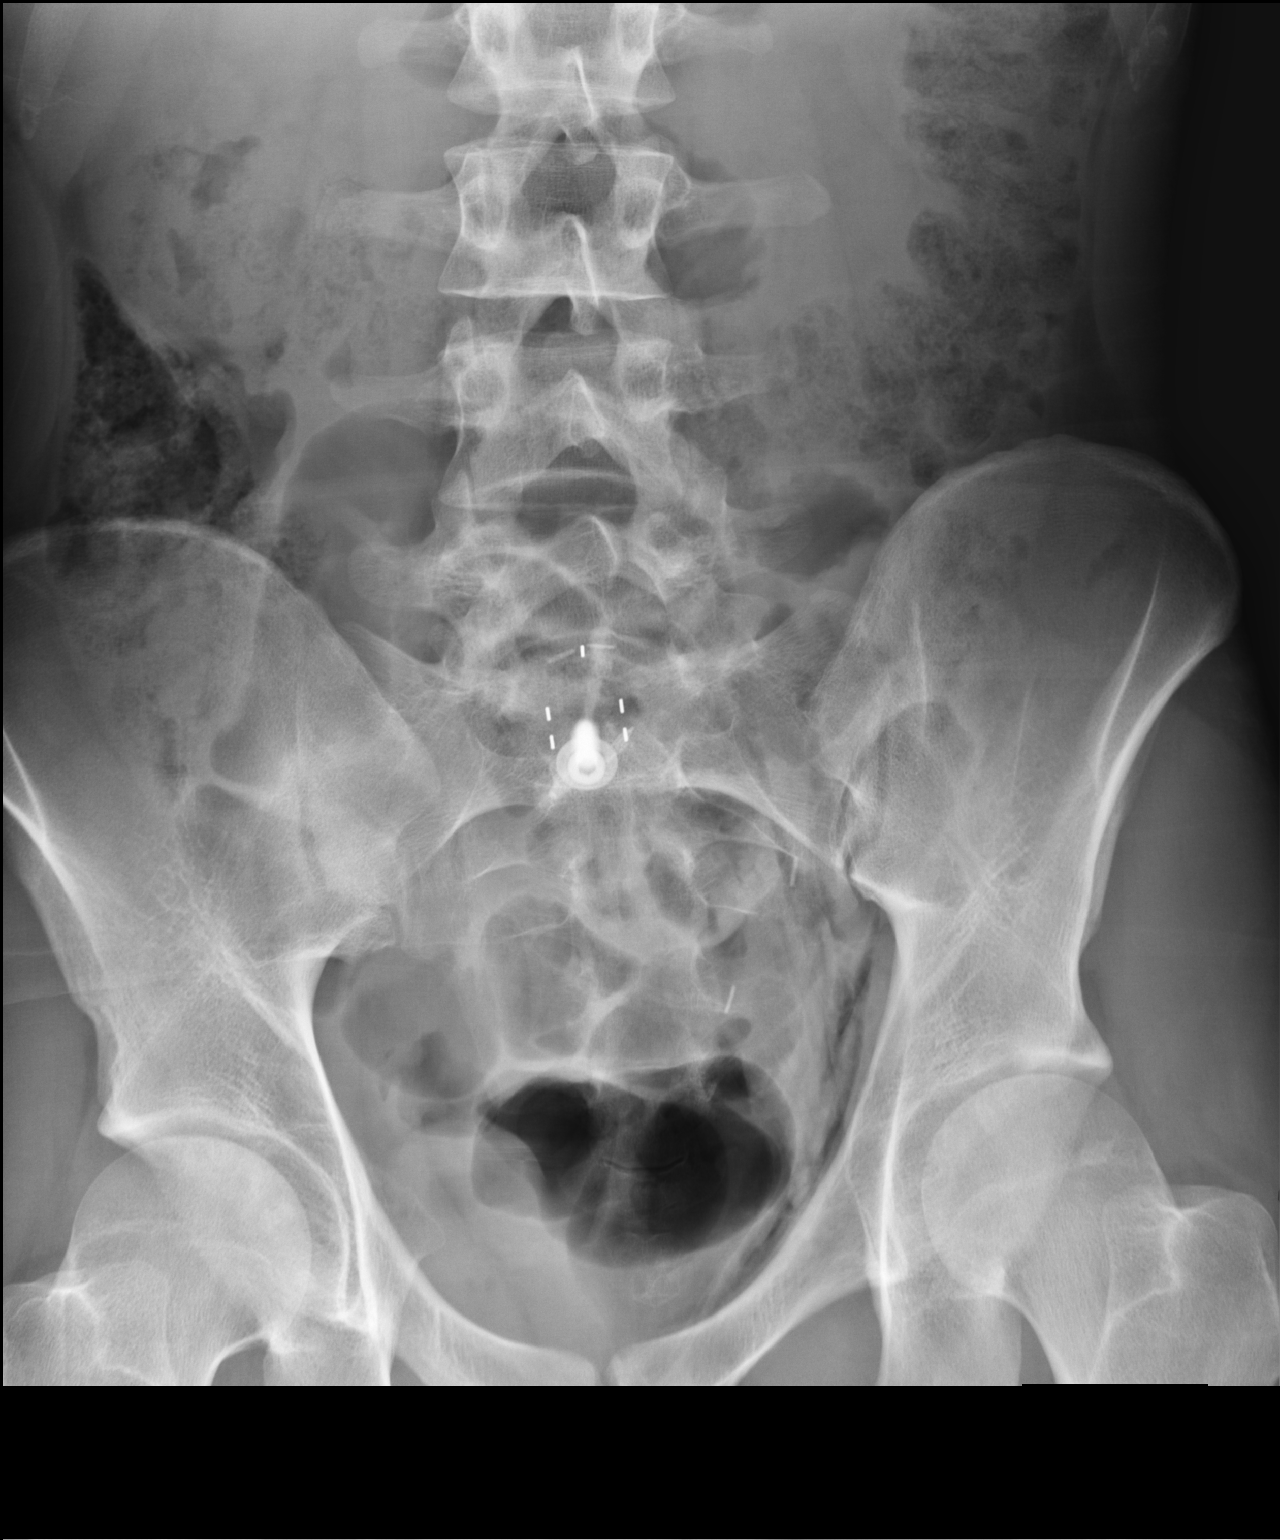

[1 of 1 positions shown; findings below may reference images not displayed]

FINDINGS: Postsurgical changes of fusion at L5-S1 are noted. Two surgical
clips are present over the left hemipelvis. No other radiopaque
foreign body is present. The bowel gas pattern is normal. The pelvis
is unremarkable.
IMPRESSION: 1. No retained surgical instruments.
2. Status post L5-S1 fusion.

## 2021-03-18 IMAGING — RF DG C-ARM 61-120 MIN
1 series · 2 of 2 positions shown · non-contrast
Comparison: Spine radiograph 07/06/2019

CLINICAL DATA: Lumbosacral spine fusion.

EXAM:
DG C-ARM 61-120 MIN

[Series 1: run · 2 of 2 slices shown]
[im 1/2]
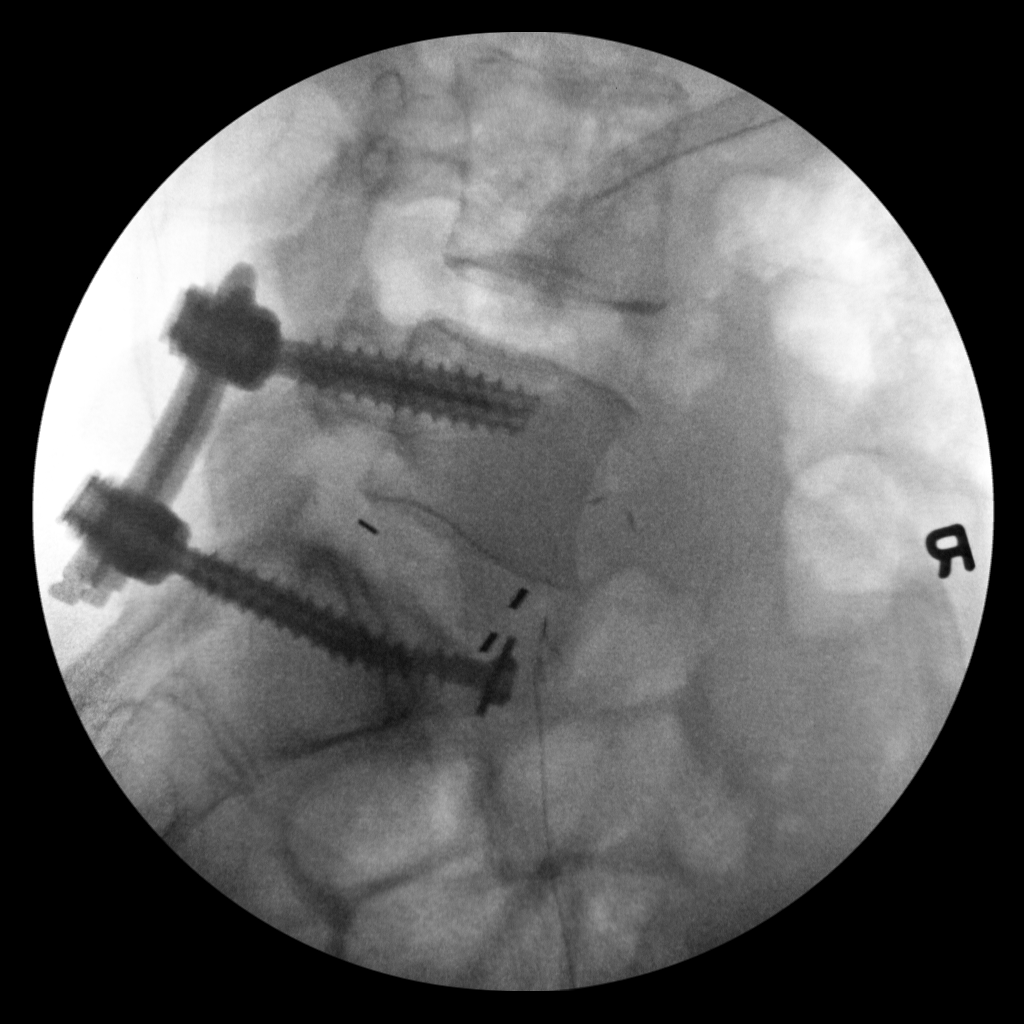
[im 2/2]
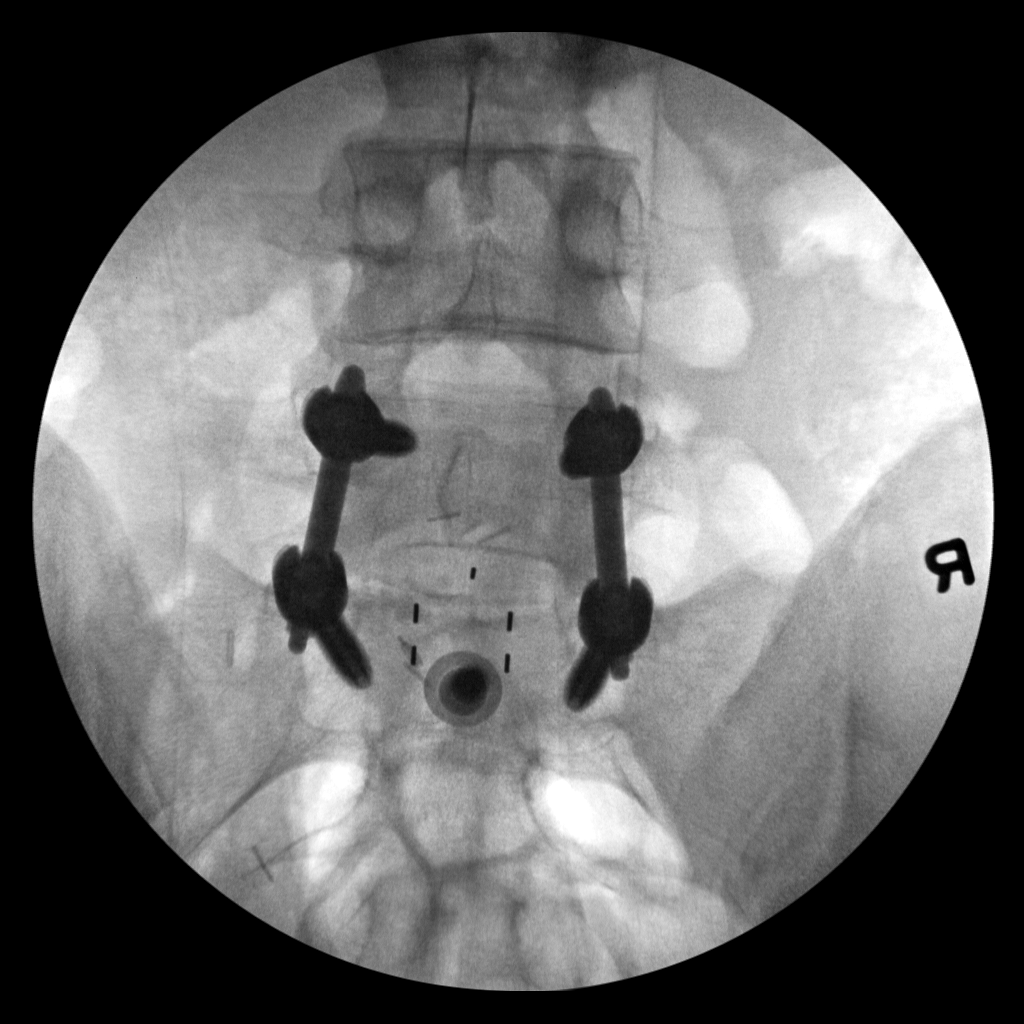

[2 of 2 positions shown; findings below may reference images not displayed]

FINDINGS: Probable or lateral fluoroscopic images from L5-S1 anterior and
posterior fusion demonstrate normal alignment of the metallic
hardware in the visualized lumbosacral spine.
IMPRESSION: L5-S1 anterior and posterior fusion.

## 2021-11-07 ENCOUNTER — Emergency Department: Admit: 2021-11-07

## 2021-11-07 ENCOUNTER — Inpatient Hospital Stay
Admit: 2021-11-07 | Discharge: 2021-11-07 | Disposition: A | Discharge status: Home / Self Care | Payer: Worker's Compensation | Attending: Student in an Organized Health Care Education/Training Program

## 2021-11-07 DIAGNOSIS — S61212A Laceration without foreign body of right middle finger without damage to nail, initial encounter: Secondary | ICD-10-CM

## 2021-11-07 MED ORDER — TETANUS-DIPHTHERIA TOXOIDS-TD 2 LF UNIT-2 LF UNIT/0.5 ML IM SUSP
2-2 Lf unit/0.5 mL | Freq: Once | INTRAMUSCULAR | Status: AC
Start: 2021-11-07 — End: 2021-11-07
  Administered 2021-11-07: 08:00:00 via INTRAMUSCULAR

## 2021-11-07 MED ORDER — OXYCODONE-ACETAMINOPHEN 5 MG-325 MG TAB
5-325 mg | Freq: Once | ORAL | Status: AC
Start: 2021-11-07 — End: 2021-11-07
  Administered 2021-11-07: 09:00:00 via ORAL

## 2021-11-07 MED ORDER — TRIMETHOPRIM-SULFAMETHOXAZOLE 160 MG-800 MG TAB
160-800 mg | ORAL_TABLET | Freq: Two times a day (BID) | ORAL | 0 refills | Status: AC
Start: 2021-11-07 — End: 2021-11-14

## 2021-11-07 MED ORDER — LIDOCAINE (PF) 10 MG/ML (1 %) IJ SOLN
10 mg/mL (1 %) | Freq: Once | INTRAMUSCULAR | Status: AC
Start: 2021-11-07 — End: 2021-11-07
  Administered 2021-11-07: 08:00:00 via INTRADERMAL

## 2021-11-07 MED FILL — LIDOCAINE (PF) 10 MG/ML (1 %) IJ SOLN: 10 mg/mL (1 %) | INTRAMUSCULAR | Qty: 12

## 2021-11-07 MED FILL — TDVAX 2 LF UNIT-2 LF UNIT/0.5 ML INTRAMUSCULAR SUSPENSION: 2-2 Lf unit/0.5 mL | INTRAMUSCULAR | Qty: 0.5

## 2021-11-07 MED FILL — OXYCODONE-ACETAMINOPHEN 5 MG-325 MG TAB: 5-325 mg | ORAL | Qty: 1

## 2021-11-07 NOTE — ED Notes (Signed)
Wound dressed after suturing.

## 2021-11-07 NOTE — ED Provider Notes (Signed)
26 year old male presents to the ED with right hand laceration sustained 15 minutes ago.  Patient was working with a concrete saw and accidentally lacerated the distal aspect of his right third finger.  Patient is right-hand dominant.  Unknown last tetanus immunization.  Denies any numbness or weakness.  Bleeding controlled prior to arrival.    The history is provided by the patient.   Laceration   Pertinent negatives include no numbness and no weakness.      History reviewed. No pertinent past medical history.    History reviewed. No pertinent surgical history.      History reviewed. No pertinent family history.    Social History     Socioeconomic History    Marital status: MARRIED     Spouse name: Not on file    Number of children: Not on file    Years of education: Not on file    Highest education level: Not on file   Occupational History    Not on file   Tobacco Use    Smoking status: Every Day     Types: Cigarettes    Smokeless tobacco: Never   Substance and Sexual Activity    Alcohol use: Yes     Comment: occasional    Drug use: Never    Sexual activity: Not on file   Other Topics Concern    Not on file   Social History Narrative    Not on file     Social Determinants of Health     Financial Resource Strain: Not on file   Food Insecurity: Not on file   Transportation Needs: Not on file   Physical Activity: Not on file   Stress: Not on file   Social Connections: Not on file   Intimate Partner Violence: Not on file   Housing Stability: Not on file         ALLERGIES: Patient has no known allergies.    Review of Systems   Constitutional:  Negative for chills and fever.   HENT:  Negative for congestion and rhinorrhea.    Respiratory:  Negative for cough, shortness of breath and wheezing.    Cardiovascular:  Negative for chest pain and leg swelling.   Gastrointestinal:  Negative for abdominal pain, constipation, diarrhea, nausea and vomiting.   Genitourinary:  Negative for difficulty urinating, dysuria and  hematuria.   Musculoskeletal:  Negative for back pain and neck pain.   Skin:  Positive for wound. Negative for color change and rash.   Neurological:  Negative for weakness, numbness and headaches.   Psychiatric/Behavioral:  Negative for behavioral problems and confusion.      Vitals:    11/07/21 0236   BP: 96/67   Pulse: (!) 50   Resp: 18   Temp: 97.8 ??F (36.6 ??C)   SpO2: 99%   Weight: 69.9 kg (154 lb 1.6 oz)   Height: 6' (1.829 m)            Physical Exam  Constitutional:       General: He is not in acute distress.     Appearance: He is well-developed.   HENT:      Head: Normocephalic and atraumatic.   Eyes:      Conjunctiva/sclera: Conjunctivae normal.      Pupils: Pupils are equal, round, and reactive to light.   Neck:      Trachea: No tracheal deviation.   Cardiovascular:      Rate and Rhythm: Normal rate and regular rhythm.  Heart sounds: No murmur heard.    No friction rub. No gallop.   Pulmonary:      Effort: No respiratory distress.      Breath sounds: Normal breath sounds.   Abdominal:      General: Bowel sounds are normal. There is no distension.      Palpations: Abdomen is soft.      Tenderness: There is no abdominal tenderness.   Musculoskeletal:         General: No deformity.      Cervical back: Neck supple.      Comments: 2 cm laceration to the palmar aspect of the distal phalanx of the right third finger.  Cap refill intact distally.  Intact flexion of the finger though with significant pain.  Oozing blood.   Skin:     General: Skin is warm and dry.   Neurological:      Mental Status: He is alert and oriented to person, place, and time.        MDM  Number of Diagnoses or Management Options  Laceration of right middle finger without foreign body, nail damage status unspecified, initial encounter  Diagnosis management comments: 26 year old male presenting to the ED with right third finger laceration from concrete saw.  Will obtain x-ray to rule out foreign body or fracture.  Will perform digital  block and plan on washout of wound followed by repair with sutures.       Amount and/or Complexity of Data Reviewed  Tests in the radiology section of CPT??: ordered and reviewed           Procedures    Procedure Note - Wound Repair:    Performed by Raylene Everts, MD .     Immediately prior to the procedure, the patient was reevaluated and found suitable for the planned procedure and any planned medications.    Immediately prior to the procedure a time out was called to verify the correct patient, procedure, equipment, staff, and marking as appropriate.     Tendon/Joint function was Intact. Neurovascular function was Intact.  Site prepped with Sterile draping.  Anesthesia was obtained via digital block using 10 mL lidocaine 1% without epinephrine. Wound irrigated with copious amounts of normal saline and explored.  Wound was located on the right middle finger, measured 3 cm and was linear with ragged edges.  Level of complexity was: simple.    Wound was closed using One layer suture closure: Skin Layer:  6 sutures placed, stitch type:simple interrupted, suture: 5-0 nylon..  Foreign body was not suspected.  Foreign body was not found.  Procedure was tolerated well.    DISCHARGE NOTE:  4:15 AM  The patient has been re-evaluated and feeling much better and are stable for discharge.  All available radiology and laboratory results have been reviewed with patient and/or available family.  Patient and/or family verbally conveyed their understanding and agreement of the patient's signs, symptoms, diagnosis, treatment and prognosis and additionally agree to follow-up as recommended in the discharge instructions or to return to the Emergency Department should their condition change or worsen prior to their follow-up appointment.  All questions have been answered and patient and/or available family express understanding.      LABORATORY RESULTS:  No results found for this or any previous visit (from the past 24  hour(s)).    IMAGING RESULTS:  XR 3RD FINGER RT MIN 2 V    Result Date: 11/07/2021  Laceration of the distal aspect of the  third digit. No evidence of fracture. No definite foreign bodies are identified     MEDICATIONS GIVEN:  Medications   lidocaine (PF) (XYLOCAINE) 10 mg/mL (1 %) injection 12 mL (12 mL IntraDERMal Given 11/07/21 0246)   tetanus-diphtheria toxoids-Td (Td) 2-2 Lf unit/0.5 mL injection 0.5 mL (0.5 mL IntraMUSCular Given 11/07/21 0259)   oxyCODONE-acetaminophen (PERCOCET) 5-325 mg per tablet 1 Tablet (1 Tablet Oral Given 11/07/21 0409)       IMPRESSION:  1. Laceration of right middle finger without foreign body, nail damage status unspecified, initial encounter        PLAN:  Follow-up Information       Follow up With Specialties Details Why Contact Info    [facility] Emergency [facility] In 1 week For suture removal [id number] [address]  [place] [address]  [phone]          Current Discharge Medication List        START taking these medications    Details   trimethoprim-sulfamethoxazole (Bactrim DS) 160-800 mg per tablet Take 1 Tablet by mouth two (2) times a day for 7 days.  Qty: 14 Tablet, Refills: 0  Start date: 11/07/2021, End date: 11/14/2021             Signed By: Raylene Everts, MD     November 07, 2021

## 2021-11-07 NOTE — ED Notes (Signed)
Pt reports working with a concrete saw and accidentally cutting his 3rd digit. Laceration present to 3rd digit of Rt hand. Oozing blood at this time. Gauze placed. Tetanus not up to date.

## 2021-11-07 NOTE — ED Notes (Signed)
I have reviewed discharge instructions with the patient.  The patient verbalized understanding.

## 2021-11-07 NOTE — ED Notes (Signed)
Lidocaine given by MD

## 2022-11-30 DIAGNOSIS — Z419 Encounter for procedure for purposes other than remedying health state, unspecified: Secondary | ICD-10-CM | POA: Diagnosis not present

## 2022-12-31 DIAGNOSIS — Z419 Encounter for procedure for purposes other than remedying health state, unspecified: Secondary | ICD-10-CM | POA: Diagnosis not present

## 2023-01-31 DIAGNOSIS — Z419 Encounter for procedure for purposes other than remedying health state, unspecified: Secondary | ICD-10-CM | POA: Diagnosis not present

## 2023-03-01 DIAGNOSIS — Z419 Encounter for procedure for purposes other than remedying health state, unspecified: Secondary | ICD-10-CM | POA: Diagnosis not present

## 2023-03-21 ENCOUNTER — Telehealth: Payer: Self-pay

## 2023-03-21 NOTE — Telephone Encounter (Signed)
Mychart msg sent

## 2023-04-01 DIAGNOSIS — Z419 Encounter for procedure for purposes other than remedying health state, unspecified: Secondary | ICD-10-CM | POA: Diagnosis not present
